# Patient Record
Sex: Female | Born: 1960 | Race: Black or African American | Hispanic: No | Smoking: Former smoker
Health system: Southern US, Community
[De-identification: ages and names within clinical notes are randomized; demographics above are authoritative.]

## PROBLEM LIST (undated history)

## (undated) DIAGNOSIS — M359 Systemic involvement of connective tissue, unspecified: Secondary | ICD-10-CM

## (undated) DIAGNOSIS — C801 Malignant (primary) neoplasm, unspecified: Secondary | ICD-10-CM

## (undated) DIAGNOSIS — M797 Fibromyalgia: Secondary | ICD-10-CM

## (undated) DIAGNOSIS — M329 Systemic lupus erythematosus, unspecified: Secondary | ICD-10-CM

## (undated) DIAGNOSIS — M069 Rheumatoid arthritis, unspecified: Secondary | ICD-10-CM

## (undated) DIAGNOSIS — IMO0002 Reserved for concepts with insufficient information to code with codable children: Secondary | ICD-10-CM

## (undated) DIAGNOSIS — Z8505 Personal history of malignant neoplasm of liver: Secondary | ICD-10-CM

## (undated) DIAGNOSIS — E119 Type 2 diabetes mellitus without complications: Secondary | ICD-10-CM

## (undated) HISTORY — PX: PANCREATECTOMY: SHX1019

## (undated) HISTORY — PX: LIVER SURGERY: SHX698

## (undated) HISTORY — PX: TOTAL ABDOMINAL HYSTERECTOMY: SHX209

## (undated) HISTORY — PX: PANCREAS SURGERY: SHX731

## (undated) HISTORY — PX: BACK SURGERY: SHX140

## (undated) HISTORY — PX: CHOLECYSTECTOMY: SHX55

## (undated) HISTORY — PX: ROTATOR CUFF REPAIR: SHX139

---

## 2016-08-05 ENCOUNTER — Emergency Department
Admission: EM | Admit: 2016-08-05 | Discharge: 2016-08-05 | Disposition: A | Discharge status: Home / Self Care | Payer: Medicare Other | Attending: Emergency Medicine | Admitting: Emergency Medicine

## 2016-08-05 DIAGNOSIS — Z87891 Personal history of nicotine dependence: Secondary | ICD-10-CM | POA: Diagnosis not present

## 2016-08-05 DIAGNOSIS — H9202 Otalgia, left ear: Secondary | ICD-10-CM | POA: Diagnosis not present

## 2016-08-05 MED ORDER — ACETAMINOPHEN-CODEINE #3 300-30 MG PO TABS: 1.0000 | ORAL_TABLET | Freq: Four times a day (QID) | ORAL | 0 refills | Status: DC | PRN

## 2016-08-05 NOTE — Discharge Instructions (Signed)
Your exam is normal. You appear to be having pain from a small bump in the ear canal. Take the pain medicine as needed. Use a cotton swab dampened with alcohol to dry the pimple. Follow-up with your provider for continued symptoms.

## 2016-08-05 NOTE — ED Triage Notes (Signed)
Pt presents to ED for L ear pain that began 1.5 hours ago. States L sided facial pressure when she bent over earlier.

## 2016-08-06 NOTE — ED Provider Notes (Signed)
[facility] Emergency Department Provider Note ____________________________________________  Time seen: 2040  I have reviewed the triage vital signs and the nursing notes.  HISTORY  Chief Complaint  Otalgia  HPI Betty Ferguson is a 55 y.o. female presents to the ED for evaluation of acute pain to the left ear that began about an hour and half prior to arrival. Patient describes she has pain and pressure at the left-sided face and ear and tenderness when she moves the ear. She denies any spontaneous drainage, or fevers. She also denies any recent injury or illness. She did start her daily allergy medicine today due to some sudden sinus congestion. She is otherwise without vertigo, tinnitus, or hearing loss.  No past medical history on file.  There are no active problems to display for this patient.   Past Surgical History:  Procedure Laterality Date  . BACK SURGERY    . CHOLECYSTECTOMY    . TOTAL ABDOMINAL HYSTERECTOMY      Prior to Admission medications   Medication Sig Start Date End Date Taking? Authorizing Provider  acetaminophen-codeine (TYLENOL #3) 300-30 MG tablet Take 1 tablet by mouth every 6 (six) hours as needed for moderate pain. 08/05/16   Elita Dame V Bacon Shaunn Tackitt, PA-C    Allergies Morphine and related and Toradol [ketorolac tromethamine]  No family history on file.  Social History Social History  Substance Use Topics  . Smoking status: Former Research scientist (life sciences)  . Smokeless tobacco: Not on file  . Alcohol use No    Review of Systems  Constitutional: Negative for fever. Eyes: Negative for visual changes. ENT: Negative for sore throat.Left otalgia as above. Cardiovascular: Negative for chest pain. Respiratory: Negative for shortness of breath. Gastrointestinal: Negative for abdominal pain, vomiting and diarrhea. Skin: Negative for rash. Neurological: Negative for headaches, focal weakness or  numbness. ____________________________________________  PHYSICAL EXAM:  VITAL SIGNS: ED Triage Vitals  Enc Vitals Group     BP 08/05/16 2114 (!) 160/86     Pulse Rate 08/05/16 2114 66     Resp 08/05/16 2114 18     Temp 08/05/16 2114 98.2 F (36.8 C)     Temp Source 08/05/16 2114 Oral     SpO2 08/05/16 2114 100 %     Weight 08/05/16 2114 160 lb (72.6 kg)     Height 08/05/16 2114 5\' 2"  (1.575 m)     Head Circumference --      Peak Flow --      Pain Score 08/05/16 2115 9     Pain Loc --      Pain Edu? --      Excl. in [place]? --    Constitutional: Alert and oriented. Well appearing and in no distress. Head: Normocephalic and atraumatic. Eyes: Conjunctivae are normal. PERRL. Normal extraocular movements Ears: Canals clear. TMs intact bilaterally. Left ear canal with a proximal papule noted to the floor of the canal. Patient is exquisitely tender to palpation over this area with the autoscope tip. No canal edema is appreciated. Nose: No congestion/rhinorrhea/epistaxis. Mouth/Throat: Mucous membranes are moist. Hematological/Lymphatic/Immunological: No preauricular lymphadenopathy. Cardiovascular: Normal rate, regular rhythm. Normal distal pulses. Respiratory: Normal respiratory effort. No wheezes/rales/rhonchi. Neurologic:  Normal gait without ataxia. Normal speech and language. No gross focal neurologic deficits are appreciated. Skin:  Skin is warm, dry and intact. No rash noted. ____________________________________________  INITIAL IMPRESSION / ASSESSMENT AND PLAN / ED COURSE  Patient with acute left ear pain which appears to be due to a small inclusion cyst in  the canal. No sign of any acute otitis media or otitis externa. She is discharged with a prescription for #10 Tylenol with codeine to dosed as needed for pain. She will follow with primary care provider for ongoing symptom management.  Clinical Course   ____________________________________________  FINAL CLINICAL  IMPRESSION(S) / ED DIAGNOSES  Final diagnoses:  Otalgia of left ear  Left ear pain      Melvenia Needles, PA-C 08/06/16 0042    Nance Pear, MD 08/07/16 1339

## 2016-09-07 ENCOUNTER — Ambulatory Visit (HOSPITAL_COMMUNITY)
Admission: AD | Admit: 2016-09-07 | Discharge: 2016-09-07 | Disposition: A | Discharge status: Home / Self Care | Payer: Medicare Other | Source: Other Acute Inpatient Hospital | Attending: Emergency Medicine | Admitting: Emergency Medicine

## 2016-09-07 ENCOUNTER — Emergency Department
Admission: EM | Admit: 2016-09-07 | Discharge: 2016-09-07 | Payer: Medicare Other | Attending: Emergency Medicine | Admitting: Emergency Medicine

## 2016-09-07 ENCOUNTER — Emergency Department: Payer: Medicare Other

## 2016-09-07 DIAGNOSIS — R7989 Other specified abnormal findings of blood chemistry: Secondary | ICD-10-CM

## 2016-09-07 DIAGNOSIS — Z79899 Other long term (current) drug therapy: Secondary | ICD-10-CM | POA: Insufficient documentation

## 2016-09-07 DIAGNOSIS — N3000 Acute cystitis without hematuria: Secondary | ICD-10-CM | POA: Diagnosis not present

## 2016-09-07 DIAGNOSIS — A419 Sepsis, unspecified organism: Secondary | ICD-10-CM | POA: Diagnosis not present

## 2016-09-07 DIAGNOSIS — R509 Fever, unspecified: Secondary | ICD-10-CM | POA: Diagnosis present

## 2016-09-07 DIAGNOSIS — R945 Abnormal results of liver function studies: Secondary | ICD-10-CM | POA: Insufficient documentation

## 2016-09-07 DIAGNOSIS — E871 Hypo-osmolality and hyponatremia: Secondary | ICD-10-CM | POA: Diagnosis not present

## 2016-09-07 DIAGNOSIS — Z87891 Personal history of nicotine dependence: Secondary | ICD-10-CM | POA: Diagnosis not present

## 2016-09-07 LAB — TYPE AND SCREEN
ABO/RH(D): O NEG
ANTIBODY SCREEN: NEGATIVE

## 2016-09-07 LAB — CBC WITH DIFFERENTIAL/PLATELET
BASOS ABS: 0 10*3/uL (ref 0–0.1)
Basophils Relative: 0 %
Eosinophils Absolute: 0 10*3/uL (ref 0–0.7)
Eosinophils Relative: 0 %
HEMATOCRIT: 40.9 % (ref 35.0–47.0)
Hemoglobin: 13.7 g/dL (ref 12.0–16.0)
LYMPHS PCT: 5 %
Lymphs Abs: 1.1 10*3/uL (ref 1.0–3.6)
MCH: 28.8 pg (ref 26.0–34.0)
MCHC: 33.5 g/dL (ref 32.0–36.0)
MCV: 86 fL (ref 80.0–100.0)
MONO ABS: 1.4 10*3/uL — AB (ref 0.2–0.9)
MONOS PCT: 6 %
NEUTROS ABS: 21.6 10*3/uL — AB (ref 1.4–6.5)
Neutrophils Relative %: 89 %
Platelets: 397 10*3/uL (ref 150–440)
RBC: 4.75 MIL/uL (ref 3.80–5.20)
RDW: 14.4 % (ref 11.5–14.5)
WBC: 24.2 10*3/uL — ABNORMAL HIGH (ref 3.6–11.0)

## 2016-09-07 LAB — COMPREHENSIVE METABOLIC PANEL
ALBUMIN: 3.2 g/dL — AB (ref 3.5–5.0)
ALT: 96 U/L — ABNORMAL HIGH (ref 14–54)
ANION GAP: 11 (ref 5–15)
AST: 157 U/L — ABNORMAL HIGH (ref 15–41)
Alkaline Phosphatase: 159 U/L — ABNORMAL HIGH (ref 38–126)
BUN: 15 mg/dL (ref 6–20)
CHLORIDE: 97 mmol/L — AB (ref 101–111)
CO2: 23 mmol/L (ref 22–32)
Calcium: 9.4 mg/dL (ref 8.9–10.3)
Creatinine, Ser: 0.89 mg/dL (ref 0.44–1.00)
GFR calc Af Amer: >60 mL/min (ref >60)
GFR calc non Af Amer: >60 mL/min (ref >60)
GLUCOSE: 152 mg/dL — AB (ref 65–99)
POTASSIUM: 4 mmol/L (ref 3.5–5.1)
SODIUM: 131 mmol/L — AB (ref 135–145)
TOTAL PROTEIN: 7.3 g/dL (ref 6.5–8.1)
Total Bilirubin: 0.4 mg/dL (ref 0.3–1.2)

## 2016-09-07 LAB — LACTIC ACID, PLASMA: LACTIC ACID, VENOUS: 1.4 mmol/L (ref 0.5–1.9)

## 2016-09-07 LAB — PROTIME-INR
INR: 1.04
Prothrombin Time: 13.6 seconds (ref 11.4–15.2)

## 2016-09-07 LAB — URINALYSIS, ROUTINE W REFLEX MICROSCOPIC
BILIRUBIN URINE: NEGATIVE
Glucose, UA: NEGATIVE mg/dL
KETONES UR: NEGATIVE mg/dL
Nitrite: POSITIVE — AB
PROTEIN: NEGATIVE mg/dL
SPECIFIC GRAVITY, URINE: 1.014 (ref 1.005–1.030)
SQUAMOUS EPITHELIAL / LPF: NONE SEEN
pH: 6 (ref 5.0–8.0)

## 2016-09-07 LAB — APTT: aPTT: 36 seconds (ref 24–36)

## 2016-09-07 LAB — TROPONIN I: Troponin I: <0.03 ng/mL (ref <0.03)

## 2016-09-07 LAB — LIPASE, BLOOD: LIPASE: 26 U/L (ref 11–51)

## 2016-09-07 MED ORDER — SODIUM CHLORIDE 0.9 % IV BOLUS (SEPSIS)
1000.0000 mL | Freq: Once | INTRAVENOUS | Status: AC
  Administered 2016-09-07: 1000 mL via INTRAVENOUS

## 2016-09-07 MED ORDER — IOPAMIDOL (ISOVUE-300) INJECTION 61%
100.0000 mL | Freq: Once | INTRAVENOUS | Status: AC | PRN
  Administered 2016-09-07: 100 mL via INTRAVENOUS

## 2016-09-07 MED ORDER — ACETAMINOPHEN 650 MG RE SUPP
650.0000 mg | Freq: Once | RECTAL | Status: AC
  Administered 2016-09-07: 650 mg via RECTAL
  Filled 2016-09-07: qty 1

## 2016-09-07 MED ORDER — FENTANYL CITRATE (PF) 100 MCG/2ML IJ SOLN
50.0000 ug | Freq: Once | INTRAMUSCULAR | Status: AC
  Administered 2016-09-07: 50 ug via INTRAVENOUS
  Filled 2016-09-07: qty 2

## 2016-09-07 MED ORDER — PIPERACILLIN-TAZOBACTAM 3.375 G IVPB 30 MIN
3.3750 g | Freq: Once | INTRAVENOUS | Status: AC
  Administered 2016-09-07: 3.375 g via INTRAVENOUS
  Filled 2016-09-07: qty 50

## 2016-09-07 MED ORDER — SODIUM CHLORIDE 0.9 % IV BOLUS (SEPSIS): 250.0000 mL | Freq: Once | INTRAVENOUS | Status: DC

## 2016-09-07 MED ORDER — SODIUM CHLORIDE 0.9 % IV SOLN
1500.0000 mg | Freq: Once | INTRAVENOUS | Status: DC
  Filled 2016-09-07: qty 1500

## 2016-09-07 MED ORDER — VANCOMYCIN HCL 10 G IV SOLR: 1500.0000 mg | Freq: Once | INTRAVENOUS | Status: DC

## 2016-09-07 NOTE — ED Triage Notes (Signed)
Pt developed fever today, highest at home 103.5. Pt has taken no meds for fever. Pt recent post op, pancreatic surgery on 11/20 at [facility]. Pt has JP drain in abdomen. Pt is reported to have altered mental associated w/ fever.

## 2016-09-07 NOTE — Progress Notes (Signed)
Pharmacy Antibiotic Note  Betty Ferguson is a 55 y.o. female admitted on 09/07/2016 with sepsis.  Pharmacy has been consulted for Zosyn dosing.  Plan: Zosyn 3.375 gm IV Q8H EI  Height: 5\' 2"  (157.5 cm) Weight: 160 lb (72.6 kg) IBW/kg (Calculated) : 50.1  Temp (24hrs), Avg:103.2 F (39.6 C), Min:103.2 F (39.6 C), Max:103.2 F (39.6 C)   Recent Labs Lab 09/07/16 2158  WBC 24.2*  CREATININE 0.89    Estimated Creatinine Clearance: 66.6 mL/min (by C-G formula based on SCr of 0.89 mg/dL).    Allergies  Allergen Reactions  . Morphine And Related   . Toradol [Ketorolac Tromethamine]     Thank you for allowing pharmacy to be a part of this patient's care.  Laural Benes, Pharm.D., BCPS Clinical Pharmacist 09/07/2016 10:56 PM

## 2016-09-07 NOTE — ED Notes (Signed)
[facility] at bedside for transport. 

## 2016-09-07 NOTE — ED Provider Notes (Signed)
[facility] Emergency Department Provider Note  ____________________________________________  Time seen: Approximately 9:54 PM  I have reviewed the triage vital signs and the nursing notes.   HISTORY  Chief Complaint Fever and Post-op Problem    HPI Betty Ferguson is a 55 y.o. 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 The patient was discharged from the hospital several days ago, and over the past few days has started having fever and disorientation, not knowing where she is, or the year or the date. This is not her normal mental status. The patient is accompanied by her husband, who gives most of the present illness history, and denies any nausea or vomiting., diarrhea, urinary symptoms. The patient has a JP drain in the left upper quadrant.   History reviewed. No pertinent past medical history.  There are no active problems to display for this patient.   Past Surgical History:  Procedure Laterality Date  . BACK SURGERY    . CHOLECYSTECTOMY    . TOTAL ABDOMINAL HYSTERECTOMY      Current Outpatient Rx  . Order #: [id number] Class: Print    Allergies Morphine and related and Toradol [ketorolac tromethamine]  No family history on file.  Social History Social History  Substance Use Topics  . Smoking status: Former Research scientist (life sciences)  . Smokeless tobacco: Not on file  . Alcohol use No    Review of Systems Constitutional: Positive fever to 103. Positive altered mental status. Positive generalized malaise Eyes: No visual changes. ENT: No congestion or rhinorrhea. Cardiovascular: Denies chest pain. Denies palpitations. Respiratory: Denies shortness of breath.  No cough. Gastrointestinal: Positive abdominal pain.  No nausea, no vomiting.  No diarrhea.  No constipation. Genitourinary: Negative for dysuria. Musculoskeletal: Negative for back pain. Skin: Negative  for rash. Neurological: Negative for headaches. No focal numbness, tingling or weakness.   10-point ROS otherwise negative.  ____________________________________________   PHYSICAL EXAM:  VITAL SIGNS: ED Triage Vitals  Enc Vitals Group     BP 09/07/16 2116 102/66     Pulse Rate 09/07/16 2116 (!) 125     Resp 09/07/16 2116 (!) 22     Temp 09/07/16 2116 (!) 103.2 F (39.6 C)     Temp Source 09/07/16 2116 Oral     SpO2 09/07/16 2116 96 %     Weight 09/07/16 2117 160 lb (72.6 kg)     Height 09/07/16 2117 5\' 2"  (1.575 m)     Head Circumference --      Peak Flow --      Pain Score 09/07/16 2124 8     Pain Loc --      Pain Edu? --      Excl. in [place]? --     Constitutional: Patient is alert but disoriented. She is able to answer some questions and follow commands. GCS 15. Eyes: Conjunctivae are normal.  EOMI. No scleral icterus. No eye discharge. Head: Atraumatic. Nose: No congestion/rhinnorhea. Mouth/Throat: Mucous membranes are dry.  Neck: No stridor.  Supple.  No JVD. No meningismus. Cardiovascular: Fast rate, regular rhythm. No murmurs, rubs or gallops.  Respiratory: Tachypnea with mild accessory muscle use. No retractions. Good air exchange. No accessory muscle use or retractions. Lungs CTAB.  No wheezes, rales or ronchi. Gastrointestinal: Soft and mildly distended. Multiple laparoscopic incisions that are clean, dry, scabbed and without any purulent discharge or surrounding erythema. Patient has a JP drain in the left upper quadrant  that has some purulent discharge was serous fluid. She has tenderness to palpation diffusely in the left upper quadrant. Musculoskeletal: No LE edema. Neurologic:  Alert to person, has to be prompted twice for the year, and does not know the month.  Speech is clear.  Face and smile are symmetric.  EOMI.  Moves all extremities well. Skin:  Skin is warm, dry and intact. No rash noted. Psychiatric: Mood and affect are normal. Speech and behavior are  normal.  Normal judgement.  ____________________________________________   LABS (all labs ordered are listed, but only abnormal results are displayed)  Labs Reviewed  COMPREHENSIVE METABOLIC PANEL - Abnormal; Notable for the following:       Result Value   Sodium 131 (*)    Chloride 97 (*)    Glucose, Bld 152 (*)    Albumin 3.2 (*)    AST 157 (*)    ALT 96 (*)    Alkaline Phosphatase 159 (*)    All other components within normal limits  CBC WITH DIFFERENTIAL/PLATELET - Abnormal; Notable for the following:    WBC 24.2 (*)    Neutro Abs 21.6 (*)    Monocytes Absolute 1.4 (*)    All other components within normal limits  URINALYSIS, ROUTINE W REFLEX MICROSCOPIC - Abnormal; Notable for the following:    Color, Urine YELLOW (*)    APPearance HAZY (*)    Hgb urine dipstick SMALL (*)    Nitrite POSITIVE (*)    Leukocytes, UA MODERATE (*)    Bacteria, UA MANY (*)    All other components within normal limits  CULTURE, BLOOD (ROUTINE X 2)  CULTURE, BLOOD (ROUTINE X 2)  URINE CULTURE  RAPID INFLUENZA A&B ANTIGENS ([facility] ONLY)  LACTIC ACID, PLASMA  LIPASE, BLOOD  TROPONIN I  APTT  PROTIME-INR  LACTIC ACID, PLASMA  TYPE AND SCREEN   ____________________________________________  EKG  ED ECG REPORT I, Eula Listen, the attending physician, personally viewed and interpreted this ECG.   Date: 09/07/2016  EKG Time: 2324  Rate: 95  Rhythm: normal sinus rhythm  Axis: normal  Intervals:none  ST&T Change: No STEMI  ____________________________________________  RADIOLOGY  Dg Chest Port 1 View  Result Date: 09/07/2016 CLINICAL DATA:  Fever today.  Recent abdominal surgery. EXAM: PORTABLE CHEST 1 VIEW COMPARISON:  None. FINDINGS: A single AP portable view of the chest demonstrates no focal airspace consolidation or alveolar edema. The lungs are grossly clear. There is no large effusion or pneumothorax. Cardiac and mediastinal contours appear unremarkable. IMPRESSION: No  active disease. Electronically Signed   By: Andreas Newport M.D.   On: 09/07/2016 22:45    ____________________________________________   PROCEDURES  Procedure(s) performed: None  Procedures  Critical Care performed: Yes, see critical care note(s) ____________________________________________   INITIAL IMPRESSION / ASSESSMENT AND PLAN / ED COURSE  Pertinent labs & imaging results that were available during my care of the patient were reviewed by me and considered in my medical decision making (see chart for details).  55 y.o. female status post recent pancreatectomy for neuroendocrine tumor presenting with fever and altered mental status, abdominal pain. Overall, I'm concerned that this patient is septic. I have ordered intravenous fluid for fluid resuscitation and immediate and antibiotics with vancomycin and Zosyn. We will do a full evaluation for infection including chest x-ray, urinalysis, blood cultures, and CT abdomen and evaluating for abscess, perforation, or other surgical complication. I will treat the patient's pain with fentanyl.  I have initiated a call to  the transfer center, the patient has been accepted by her surgeon, Dr. Donnal Moat for immediate transfer.  CRITICAL CARE Performed by: Eula Listen   Total critical care time: 45 minutes  Critical care time was exclusive of separately billable procedures and treating other patients.  Critical care was necessary to treat or prevent imminent or life-threatening deterioration.  Critical care was time spent personally by me on the following activities: development of treatment plan with patient and/or surrogate as well as nursing, discussions with consultants, evaluation of patient's response to treatment, examination of patient, obtaining history from patient or surrogate, ordering and performing treatments and interventions, ordering and review of laboratory studies, ordering and review of radiographic studies,  pulse oximetry and re-evaluation of patient's condition.  ----------------------------------------- 11:26 PM on 09/07/2016 -----------------------------------------  At the time of transfer, I reevaluated the patient. Her heart rate has significantly improved and is below 100. Her blood pressure continues to be maintained. She is feeling better. Her urinalysis is positive for UTI. She has had her CT scan but we don't have the read on it yet. The images will travel with her to [facility]. She is being transferred in improved condition.  ____________________________________________  FINAL CLINICAL IMPRESSION(S) / ED DIAGNOSES  Final diagnoses:  Sepsis, due to unspecified organism [facility])  Acute cystitis without hematuria    Clinical Course       NEW MEDICATIONS STARTED DURING THIS VISIT:  New Prescriptions   No medications on file      Eula Listen, MD 09/07/16 2326

## 2016-09-10 LAB — URINE CULTURE

## 2016-09-11 NOTE — Progress Notes (Signed)
ED Antimicrobial Stewardship Positive Culture Follow Up   Danesa Nasir is an 55 y.o. female who presented to [facility] on 09/07/2016 with a chief complaint of No chief complaint on file.   Recent Results (from the past 720 hour(s))  Blood Culture (routine x 2)     Status: None (Preliminary result)   Collection Time: 09/07/16  9:58 PM  Result Value Ref Range Status   Specimen Description BLOOD  L HAND  Final   Special Requests   Final    BOTTLES DRAWN AEROBIC AND ANAEROBIC  AER 9 ML ANA 8 ML   Culture NO GROWTH 4 DAYS  Final   Report Status PENDING  Incomplete  Blood Culture (routine x 2)     Status: None (Preliminary result)   Collection Time: 09/07/16  9:58 PM  Result Value Ref Range Status   Specimen Description BLOOD  L AC  Final   Special Requests   Final    BOTTLES DRAWN AEROBIC AND ANAEROBIC  AER 15 ML ANA 12 ML    Culture NO GROWTH 4 DAYS  Final   Report Status PENDING  Incomplete  Urine culture     Status: Abnormal   Collection Time: 09/07/16 10:55 PM  Result Value Ref Range Status   Specimen Description URINE, RANDOM  Final   Special Requests NONE  Final   Culture >=100,000 COLONIES/mL ESCHERICHIA COLI (A)  Final   Report Status 09/10/2016 FINAL  Final   Organism ID, Bacteria ESCHERICHIA COLI (A)  Final      Susceptibility   Escherichia coli - MIC*    AMPICILLIN >=32 RESISTANT Resistant     CEFAZOLIN <=4 SENSITIVE Sensitive     CEFTRIAXONE <=1 SENSITIVE Sensitive     CIPROFLOXACIN <=0.25 SENSITIVE Sensitive     GENTAMICIN <=1 SENSITIVE Sensitive     IMIPENEM <=0.25 SENSITIVE Sensitive     NITROFURANTOIN <=16 SENSITIVE Sensitive     TRIMETH/SULFA >=320 RESISTANT Resistant     AMPICILLIN/SULBACTAM 16 INTERMEDIATE Intermediate     PIP/TAZO <=4 SENSITIVE Sensitive     Extended ESBL NEGATIVE Sensitive     * >=100,000 COLONIES/mL ESCHERICHIA COLI    Patient was transferred from [facility] ED to [facility] on 12/5 with sepsis. Urine culture resulted today  growing E. Coli. [facility] but they informed me that patient had been discharged this afternoon.  Discussed case with Dr. Burlene Arnt in [facility] ED. Patient was discharged on Augmentin for a UTI from [place]. Urine culture growing E.coli with intermediate suseptibilities to ampicillin/sulbactam. Per MD, will call patient and give new prescription for cephalexin 500 mg PO BID x 7 days.  Called patient at 63 - family reports she is currently on the way home from [place]. Left phone number asking her to return phone call to [place].  New antibiotic prescription: cephalexin 500 mg PO BID x 7 days   ED Provider: Dr. Karsten Ro, PharmD Clinical Pharmacist 09/11/2016, 4:27 PM

## 2016-09-11 NOTE — ED Provider Notes (Signed)
-----------------------------------------   4:16 PM on 09/11/2016 -----------------------------------------  Patient culture resulted with a intermediate sensitivity to Unasyn, she was sent home from [facility], according to records, on Augmentin. This is possibly not the optimal course for her. We will change to Keflex. We'll advise the patient return if she feels worse.   Schuyler Amor, MD 09/11/16 [phone]

## 2016-09-12 LAB — CULTURE, BLOOD (ROUTINE X 2)
Culture: NO GROWTH
Culture: NO GROWTH

## 2016-09-12 NOTE — Progress Notes (Signed)
Pharmacist spoke with patient regarding ED cultures on 12/9 (see note). Prescription called in this morning to [facility] in [place] [phone]).   Rx: cephalexin 500 mg PO BID x 7 days MD: Tilman Neat, PharmD, BCPS 09/12/16 8:08 AM

## 2016-10-19 ENCOUNTER — Encounter: Payer: Self-pay | Admitting: Emergency Medicine

## 2016-10-19 ENCOUNTER — Emergency Department: Payer: Medicare Other

## 2016-10-19 ENCOUNTER — Emergency Department
Admission: EM | Admit: 2016-10-19 | Discharge: 2016-10-19 | Disposition: A | Discharge status: Home / Self Care | Payer: Medicare Other | Attending: Emergency Medicine | Admitting: Emergency Medicine

## 2016-10-19 DIAGNOSIS — R109 Unspecified abdominal pain: Secondary | ICD-10-CM | POA: Diagnosis present

## 2016-10-19 DIAGNOSIS — Z87891 Personal history of nicotine dependence: Secondary | ICD-10-CM | POA: Insufficient documentation

## 2016-10-19 DIAGNOSIS — Z79899 Other long term (current) drug therapy: Secondary | ICD-10-CM | POA: Diagnosis not present

## 2016-10-19 DIAGNOSIS — K5900 Constipation, unspecified: Secondary | ICD-10-CM | POA: Diagnosis not present

## 2016-10-19 HISTORY — DX: Reserved for concepts with insufficient information to code with codable children: IMO0002

## 2016-10-19 HISTORY — DX: Systemic lupus erythematosus, unspecified: M32.9

## 2016-10-19 HISTORY — DX: Fibromyalgia: M79.7

## 2016-10-19 LAB — COMPREHENSIVE METABOLIC PANEL
ALBUMIN: 3.6 g/dL (ref 3.5–5.0)
ALK PHOS: 98 U/L (ref 38–126)
ALT: 9 U/L — ABNORMAL LOW (ref 14–54)
ANION GAP: 7 (ref 5–15)
AST: 17 U/L (ref 15–41)
BILIRUBIN TOTAL: 0.3 mg/dL (ref 0.3–1.2)
BUN: 14 mg/dL (ref 6–20)
CO2: 29 mmol/L (ref 22–32)
Calcium: 9.6 mg/dL (ref 8.9–10.3)
Chloride: 101 mmol/L (ref 101–111)
Creatinine, Ser: 0.95 mg/dL (ref 0.44–1.00)
GLUCOSE: 104 mg/dL — AB (ref 65–99)
POTASSIUM: 3.9 mmol/L (ref 3.5–5.1)
Sodium: 137 mmol/L (ref 135–145)
TOTAL PROTEIN: 7.4 g/dL (ref 6.5–8.1)

## 2016-10-19 LAB — CBC
HCT: 44.1 % (ref 35.0–47.0)
HEMOGLOBIN: 14.3 g/dL (ref 12.0–16.0)
MCH: 28 pg (ref 26.0–34.0)
MCHC: 32.4 g/dL (ref 32.0–36.0)
MCV: 86.7 fL (ref 80.0–100.0)
Platelets: 234 10*3/uL (ref 150–440)
RBC: 5.09 MIL/uL (ref 3.80–5.20)
RDW: 14.9 % — ABNORMAL HIGH (ref 11.5–14.5)
WBC: 6 10*3/uL (ref 3.6–11.0)

## 2016-10-19 LAB — URINALYSIS, COMPLETE (UACMP) WITH MICROSCOPIC
BILIRUBIN URINE: NEGATIVE
Glucose, UA: NEGATIVE mg/dL
HGB URINE DIPSTICK: NEGATIVE
KETONES UR: NEGATIVE mg/dL
LEUKOCYTES UA: NEGATIVE
NITRITE: NEGATIVE
Protein, ur: NEGATIVE mg/dL
SPECIFIC GRAVITY, URINE: 1.009 (ref 1.005–1.030)
pH: 6 (ref 5.0–8.0)

## 2016-10-19 LAB — LIPASE, BLOOD: Lipase: 37 U/L (ref 11–51)

## 2016-10-19 MED ORDER — IOPAMIDOL (ISOVUE-300) INJECTION 61%
100.0000 mL | Freq: Once | INTRAVENOUS | Status: AC | PRN
  Administered 2016-10-19: 100 mL via INTRAVENOUS

## 2016-10-19 MED ORDER — IOPAMIDOL (ISOVUE-300) INJECTION 61%
15.0000 mL | INTRAVENOUS | Status: AC
  Administered 2016-10-19 (×2): 15 mL via ORAL

## 2016-10-19 MED ORDER — HYDROMORPHONE HCL 1 MG/ML IJ SOLN
1.0000 mg | Freq: Once | INTRAMUSCULAR | Status: AC
  Administered 2016-10-19: 1 mg via INTRAVENOUS
  Filled 2016-10-19: qty 1

## 2016-10-19 MED ORDER — ONDANSETRON HCL 4 MG/2ML IJ SOLN
4.0000 mg | Freq: Once | INTRAMUSCULAR | Status: AC
  Administered 2016-10-19: 4 mg via INTRAVENOUS
  Filled 2016-10-19: qty 2

## 2016-10-19 MED ORDER — SODIUM CHLORIDE 0.9 % IV BOLUS (SEPSIS)
1000.0000 mL | Freq: Once | INTRAVENOUS | Status: AC
  Administered 2016-10-19: 1000 mL via INTRAVENOUS

## 2016-10-19 NOTE — Discharge Instructions (Signed)
As we discussed please increase her MiraLAX to 3 times daily and use a fleets enema for symptom relief. Return to the emergency department for any worsening abdominal pain, fever, or any other symptom personally concerning to yourself. Please follow-up with your surgeon as soon as possible, call tomorrow to inform them of today's ER visit.

## 2016-10-19 NOTE — ED Notes (Addendum)
MD at the bedside to discuss CT results and plan of care.

## 2016-10-19 NOTE — ED Triage Notes (Signed)
Pt to ED c/o LUQ pain for "a while" worse since Sunday and SOB as well.  States had pancreas removed November 20th at [facility].  Pt states nausea, no v/d.

## 2016-10-19 NOTE — ED Notes (Signed)
Pt resting on stretcher in hallway. Had been complaining of returning/worsening pain. MD aware and order received for pain medication. IV fluids infusing without difficulty. Awaiting CT results. Provided for comfort and safety and will continue to assess.

## 2016-10-19 NOTE — ED Provider Notes (Signed)
[facility] Emergency Department Provider Note  Time seen: 4:52 PM  I have reviewed the triage vital signs and the nursing notes.   HISTORY  Chief Complaint Abdominal Pain and Post-op Problem    HPI Betty Ferguson is a 56 y.o. female  with a history of fibromyalgia, lupus, pancreatectomy in November 2017, surgical drain removed last week who presents to the emergency department for increased left-sided abdominal pain. Patient states she has somewhat chronic abdominal pain for which she is prescribed pain medication. She states over the past 3 days significant worsening of the pain describes as severe currently. Has been taking her home Pain medication without relief so she came to the emergency department. Patient states she spoke to her nurse at [facility] recommended she come to the local ER for evaluation. Patient denies fever. States some nausea but denies vomiting. Describes the pain as dull aching constant pain  Past Medical History:  Diagnosis Date  . Fibromyalgia   . Lupus     There are no active problems to display for this patient.   Past Surgical History:  Procedure Laterality Date  . BACK SURGERY    . CHOLECYSTECTOMY    . PANCREATECTOMY    . ROTATOR CUFF REPAIR Right   . TOTAL ABDOMINAL HYSTERECTOMY      Prior to Admission medications   Medication Sig Start Date End Date Taking? Authorizing Provider  acetaminophen-codeine (TYLENOL #3) 300-30 MG tablet Take 1 tablet by mouth every 6 (six) hours as needed for moderate pain. 08/05/16   Jenise V Bacon Menshew, PA-C    Allergies  Allergen Reactions  . Morphine And Related   . Toradol [Ketorolac Tromethamine]     History reviewed. No pertinent family history.  Social History Social History  Substance Use Topics  . Smoking status: Former Research scientist (life sciences)  . Smokeless tobacco: Never Used  . Alcohol use No    Review of Systems Constitutional: Negative for fever. Cardiovascular: Negative for chest  pain. Respiratory: Negative for shortness of breath. Gastrointestinal:  left-sided abdominal pain. Negative for vomiting. Positive for nausea. Genitourinary: Negative for dysuria. Neurological: Negative for headache 10-point ROS otherwise negative.  ____________________________________________   PHYSICAL EXAM:  VITAL SIGNS: ED Triage Vitals  Enc Vitals Group     BP 10/19/16 1304 130/79     Pulse Rate 10/19/16 1304 84     Resp 10/19/16 1304 20     Temp 10/19/16 1304 98.4 F (36.9 C)     Temp Source 10/19/16 1304 Oral     SpO2 10/19/16 1304 100 %     Weight 10/19/16 1303 165 lb (74.8 kg)     Height 10/19/16 1303 5\' 2"  (1.575 m)     Head Circumference --      Peak Flow --      Pain Score 10/19/16 1311 10     Pain Loc --      Pain Edu? --      Excl. in [place]? --    Constitutional: Alert and oriented. Well appearing and in no distress. Eyes: Normal exam ENT   Head: Normocephalic and atraumatic.   Mouth/Throat: Mucous membranes are moist. Cardiovascular: Normal rate, regular rhythm. No murmurs Respiratory: Normal respiratory effort without tachypnea nor retractions. Breath sounds are clear Gastrointestinal:  Soft,  Moderate left abdominal tenderness palpation. No rebound or guarding. No distention. Musculoskeletal: Nontender with normal range of motion in all extremities.  Neurologic:  Normal speech and language. No gross focal neurologic deficits  Skin:  Skin  is warm, dry and intact.  Psychiatric: Mood and affect are normal.  ____________________________________________    EKG  EKG reviewed and interpreted by myself shows normal sinus rhythm at 72 bpm. Narrow QRS, normal axis, normal intervals, no ST changes.  ____________________________________________    RADIOLOGY  CT shows no acute findings besides moderate retained stool.   ____________________________________________   INITIAL IMPRESSION / ASSESSMENT AND PLAN / ED COURSE  Pertinent labs & imaging  results that were available during my care of the patient were reviewed by me and considered in my medical decision making (see chart for details).  Overall well-appearing patient in no distress. Patient states significant left-sided abdominal pain Patient's labs are within normal limits, white blood cell count is normal. Urinalysis is negative. Lipase is normal. LFTs are normal.Given the pplaint of increased abdominal pain one week after a surgical drain was removed we'll proceed with a CT of the abdomen/pelvis to further evaluate and rule out intra-abdominal pathology. Patient is agreeable to this plan. We'll treat the patient's pain and nausea while awaiting CT results.  CT shows a moderate amount of retained stool otherwise negative. Patient states she was having loose stools or she had stopped taking her prescribed senna and MiraLAX. I discussed with the patient the need to restart his medications as well as using a fleets enema. Patient is agreeable. I discussed return precautions for any increased pain or fever. Otherwise the patient will call her surgeon tomorrow.  ____________________________________________   FINAL CLINICAL IMPRESSION(S) / ED DIAGNOSES  Left-sided abdominal pain constipation   Harvest Dark, MD 10/19/16 2018

## 2016-10-19 NOTE — ED Triage Notes (Signed)
Pt reports hx of pancreas removal in November, reports pain in abdomen and some shortness of breath today. Layla Barter MD and was told to come here for evaluation.

## 2017-06-01 ENCOUNTER — Emergency Department: Payer: Medicare Other

## 2017-06-01 ENCOUNTER — Emergency Department
Admission: EM | Admit: 2017-06-01 | Discharge: 2017-06-01 | Disposition: A | Discharge status: Home / Self Care | Payer: Medicare Other | Attending: Emergency Medicine | Admitting: Emergency Medicine

## 2017-06-01 ENCOUNTER — Encounter: Payer: Self-pay | Admitting: *Deleted

## 2017-06-01 DIAGNOSIS — Z87891 Personal history of nicotine dependence: Secondary | ICD-10-CM | POA: Diagnosis not present

## 2017-06-01 DIAGNOSIS — M79604 Pain in right leg: Secondary | ICD-10-CM | POA: Diagnosis not present

## 2017-06-01 DIAGNOSIS — M797 Fibromyalgia: Secondary | ICD-10-CM | POA: Diagnosis not present

## 2017-06-01 DIAGNOSIS — M25561 Pain in right knee: Secondary | ICD-10-CM

## 2017-06-01 MED ORDER — DICLOFENAC SODIUM 75 MG PO TBEC: 75.0000 mg | DELAYED_RELEASE_TABLET | Freq: Two times a day (BID) | ORAL | 0 refills | Status: DC

## 2017-06-01 MED ORDER — METOCLOPRAMIDE HCL 10 MG PO TABS
10.0000 mg | ORAL_TABLET | Freq: Once | ORAL | Status: AC
  Administered 2017-06-01: 10 mg via ORAL
  Filled 2017-06-01: qty 1

## 2017-06-01 MED ORDER — ORPHENADRINE CITRATE ER 100 MG PO TB12: 100.0000 mg | ORAL_TABLET | Freq: Two times a day (BID) | ORAL | 0 refills | Status: DC | PRN

## 2017-06-01 MED ORDER — OXYCODONE-ACETAMINOPHEN 5-325 MG PO TABS
1.0000 | ORAL_TABLET | Freq: Once | ORAL | Status: AC
  Administered 2017-06-01: 1 via ORAL
  Filled 2017-06-01: qty 1

## 2017-06-01 MED ORDER — ORPHENADRINE CITRATE 30 MG/ML IJ SOLN
60.0000 mg | INTRAMUSCULAR | Status: AC
  Administered 2017-06-01: 60 mg via INTRAMUSCULAR
  Filled 2017-06-01: qty 2

## 2017-06-01 NOTE — ED Triage Notes (Signed)
Pt to triage via wheelchair.  Pt has right knee pain.  No known injury.  No swelling noted.  States painful to ambulate.  Pt alert.

## 2017-06-01 NOTE — Discharge Instructions (Signed)
Your exam, x-ray, and ultrasound study are negative for any fracture of the knee or a blood clot of the right leg. Your symptoms may represent sciatic nerve irritation from the back. Take the prescription meds  along with your home meds, as directed. Follow-up with your provider for continued symptoms. Return to the ED as needed.

## 2017-06-01 NOTE — ED Provider Notes (Signed)
[facility] Emergency Department Provider Note ____________________________________________  Time seen: 1948  I have reviewed the triage vital signs and the nursing notes.  HISTORY  Chief Complaint  Knee Pain  HPI Betty Ferguson is a 56 y.o. female presents to the ED for evaluation of right knee pain, the patient deniesany acute injury, accident, or trauma. She was apparently seen at a local urgent care prior to arrival. She was referred here for further evaluation of a potential DVT. She notes pain from the posterior thigh to the anterior & medial knee, popliteal space, and calf on interview. She notes onset of pain at about 2 pm while at home. She denies any fall, trip, or trauma. She reports pain with ambulation, but denies any catching, clicking, locking, or give-way. She has a history of chronic LBP s/p surgery, but denies RLE radicular or sciatic symptoms, past or present. She doses oxycodone, oxycontin, gabapentin, and cyclobenzaprine for pain relief. She is without bladder or bowel incontinence or distal paresthesias.   Past Medical History:  Diagnosis Date  . Fibromyalgia   . Lupus     There are no active problems to display for this patient.   Past Surgical History:  Procedure Laterality Date  . BACK SURGERY    . CHOLECYSTECTOMY    . PANCREATECTOMY    . ROTATOR CUFF REPAIR Right   . TOTAL ABDOMINAL HYSTERECTOMY      Prior to Admission medications   Medication Sig Start Date End Date Taking? Authorizing Provider  acetaminophen-codeine (TYLENOL #3) 300-30 MG tablet Take 1 tablet by mouth every 6 (six) hours as needed for moderate pain. 08/05/16   Gesenia Bantz, Dannielle Karvonen, PA-C   Allergies Morphine and related and Toradol [ketorolac tromethamine]  No family history on file.  Social History Social History  Substance Use Topics  . Smoking status: Former Research scientist (life sciences)  . Smokeless tobacco: Never Used  . Alcohol use No    Review of  Systems  Constitutional: Negative for fever. Cardiovascular: Negative for chest pain. Respiratory: Negative for shortness of breath. Musculoskeletal: Negative for back pain. Right leg and knee pain as above.  Skin: Negative for rash. Neurological: Negative for headaches, focal weakness or numbness. ____________________________________________  PHYSICAL EXAM:  VITAL SIGNS: ED Triage Vitals  Enc Vitals Group     BP 06/01/17 1822 135/73     Pulse Rate 06/01/17 1822 79     Resp 06/01/17 1822 18     Temp 06/01/17 1822 98.8 F (37.1 C)     Temp Source 06/01/17 1822 Oral     SpO2 06/01/17 1822 99 %     Weight 06/01/17 1820 185 lb (83.9 kg)     Height 06/01/17 1820 5\' 2"  (1.575 m)     Head Circumference --      Peak Flow --      Pain Score 06/01/17 1819 10     Pain Loc --      Pain Edu? --      Excl. in [place]? --     Constitutional: Alert and oriented. Well appearing and in no distress. Head: Normocephalic and atraumatic. Cardiovascular: Normal distal pulses. Musculoskeletal: Right lower extremity and knee without any obvious deformity, dislocation, effusion, or edema. Patient with normal patella tracking at the right knee. No significant valgus or varus joint stress is appreciative. Negative anterior-posterior drawer sign. Nontender with normal range of motion in all extremities.  Neurologic:  Normal gross sensation. Normal speech and language. No gross focal neurologic deficits are  appreciated. Skin:  Skin is warm, dry and intact. No rash noted. ____________________________________________   RADIOLOGY  Right Knee  IMPRESSION: Minimal degenerative change.  No acute osseous abnormality.  Venous US RLE  IMPRESSION: No evidence of DVT within the right lower extremity.  I, Sundiata Ferrick, Dannielle Karvonen, personally viewed and evaluated these images (plain radiographs) as part of my medical decision making, as well as reviewing the written report by the  radiologist. ____________________________________________  PROCEDURES  Norflex 60 mg IM Reglan 10 mg PO Oxycodone 5-325 mg PO ____________________________________________  INITIAL IMPRESSION / ASSESSMENT AND PLAN / ED COURSE  Patient with ED evaluation of sudden right lower extremity pain about the posterior leg and knee. She is evaluated here for acute knee pain and possible DVT. Her x-ray and the ultrasound are both negative and reassuring at this time. Her symptoms may represent acute on chronic radicular symptoms. Patient will be discharged to follow-up with ortho or her primary care provider for ongoing symptom management. She will continue no cervical medications and a prescription for Voltaren and orphenadrine are provided for acute anti-inflammatory & muscle spasm pain relief, respectively. ____________________________________________  FINAL CLINICAL IMPRESSION(S) / ED DIAGNOSES  Final diagnoses:  Right leg pain  Acute pain of right knee      Carmie End, Dannielle Karvonen, PA-C 06/01/17 2132    Darel Hong, MD 06/01/17 2137

## 2018-02-10 ENCOUNTER — Encounter: Payer: Self-pay | Admitting: Emergency Medicine

## 2018-02-10 DIAGNOSIS — Z79899 Other long term (current) drug therapy: Secondary | ICD-10-CM | POA: Insufficient documentation

## 2018-02-10 DIAGNOSIS — M25552 Pain in left hip: Secondary | ICD-10-CM | POA: Insufficient documentation

## 2018-02-10 DIAGNOSIS — Z87891 Personal history of nicotine dependence: Secondary | ICD-10-CM | POA: Diagnosis not present

## 2018-02-10 DIAGNOSIS — R51 Headache: Secondary | ICD-10-CM | POA: Diagnosis not present

## 2018-02-10 NOTE — ED Triage Notes (Signed)
Patient states she has a "splitting headache" 9/10 pain with blurry vision.  She states she has a hx of "chronic headaches years ago".  She also is co left hip pain.  She states last night while she was walking she felt a sting in her hip like a bee stung her and has had trouble walking since.

## 2018-02-11 ENCOUNTER — Emergency Department: Payer: Medicare Other

## 2018-02-11 ENCOUNTER — Emergency Department
Admission: EM | Admit: 2018-02-11 | Discharge: 2018-02-11 | Disposition: A | Discharge status: Home / Self Care | Payer: Medicare Other | Attending: Emergency Medicine | Admitting: Emergency Medicine

## 2018-02-11 DIAGNOSIS — R51 Headache: Secondary | ICD-10-CM | POA: Diagnosis not present

## 2018-02-11 DIAGNOSIS — R519 Headache, unspecified: Secondary | ICD-10-CM

## 2018-02-11 MED ORDER — HYDROMORPHONE HCL 1 MG/ML IJ SOLN
1.0000 mg | Freq: Once | INTRAMUSCULAR | Status: AC
  Administered 2018-02-11: 1 mg via INTRAVENOUS
  Filled 2018-02-11: qty 1

## 2018-02-11 MED ORDER — PROCHLORPERAZINE EDISYLATE 10 MG/2ML IJ SOLN
10.0000 mg | Freq: Once | INTRAMUSCULAR | Status: AC
  Administered 2018-02-11: 10 mg via INTRAVENOUS
  Filled 2018-02-11: qty 2

## 2018-02-11 MED ORDER — PROCHLORPERAZINE EDISYLATE 10 MG/2ML IJ SOLN
INTRAMUSCULAR | Status: AC
  Administered 2018-02-11: 10 mg via INTRAVENOUS
  Filled 2018-02-11: qty 2

## 2018-02-11 MED ORDER — HYDROMORPHONE HCL 1 MG/ML IJ SOLN
0.5000 mg | Freq: Once | INTRAMUSCULAR | Status: AC
  Administered 2018-02-11: 0.5 mg via INTRAVENOUS
  Filled 2018-02-11 (×2): qty 1

## 2018-02-11 MED ORDER — HYDROMORPHONE HCL 1 MG/ML IJ SOLN
1.0000 mg | Freq: Once | INTRAMUSCULAR | Status: AC
  Administered 2018-02-11: 1 mg via INTRAVENOUS

## 2018-02-11 MED ORDER — HYDROMORPHONE HCL 1 MG/ML IJ SOLN
INTRAMUSCULAR | Status: AC
  Filled 2018-02-11: qty 1

## 2018-02-11 NOTE — ED Notes (Signed)
PT states having pain in her head and her "rear end". Pt states that she feels like she was stung by something on her left side but she was not stung by anything. Pt alert and oriented x 4. Family at bedside.

## 2018-02-11 NOTE — Discharge Instructions (Signed)
Please use Tylenol for pain if it starts to come back. If it gets severe please return here. Youcan also follow-up with orthopedics for the hip if it gets bad again. Dr. Harlow Mares is on-call for orthopedics.

## 2018-02-11 NOTE — ED Provider Notes (Signed)
[facility] Emergency Department Provider Note   ____________________________________________   First MD Initiated Contact with Patient 02/11/18 [phone]     (approximate)  I have reviewed the triage vital signs and the nursing notes.   HISTORY  Chief Complaint Headache and Hip Pain    HPI Betty Ferguson is a 57 y.o. female Patient reports gradual onset of severe headache radiating from the front of the head down to the back of the neck. She had headaches like this years ago but hasn't had one for some time. She also reports she was walking earlier and felt something like a sting her and her head she been having severe pain there ever since. It starts to push on it or lying on it or move. The pain is really in her left buttocks. She wonders if she could've possibly pulled a muscle. She reports she has had hallucinations when she was on a morphine pump for her hysterectomy. She has a history of rheumatoid arthritis lupus and fibromyalgiaand has also had back surgery and rotator cuff repair   Past Medical History:  Diagnosis Date  . Fibromyalgia   . Lupus ([place])     There are no active problems to display for this patient.   Past Surgical History:  Procedure Laterality Date  . BACK SURGERY    . CHOLECYSTECTOMY    . PANCREATECTOMY    . ROTATOR CUFF REPAIR Right   . TOTAL ABDOMINAL HYSTERECTOMY      Prior to Admission medications   Medication Sig Start Date End Date Taking? Authorizing Provider  acetaminophen-codeine (TYLENOL #3) 300-30 MG tablet Take 1 tablet by mouth every 6 (six) hours as needed for moderate pain. 08/05/16   Menshew, Dannielle Karvonen, PA-C  diclofenac (VOLTAREN) 75 MG EC tablet Take 1 tablet (75 mg total) by mouth 2 (two) times daily. 06/01/17   Menshew, Dannielle Karvonen, PA-C  orphenadrine (NORFLEX) 100 MG tablet Take 1 tablet (100 mg total) by mouth 2 (two) times daily as needed for muscle spasms. 06/01/17   Menshew, Dannielle Karvonen, PA-C     Allergies Toradol [ketorolac tromethamine] and Morphine and related  No family history on file.  Social History Social History   Tobacco Use  . Smoking status: Former Research scientist (life sciences)  . Smokeless tobacco: Never Used  Substance Use Topics  . Alcohol use: No  . Drug use: No    Review of Systems  Constitutional: No fever/chills Eyes: patient says her vision is somewhat blurry ENT: No sore throat. Cardiovascular: Denies chest pain. Respiratory: Denies shortness of breath. Gastrointestinal: No abdominal pain.  No nausea, no vomiting.  No diarrhea.  No constipation. Genitourinary: Negative for dysuria. Musculoskeletal: Negative for back pain. Skin: Negative for rash. Neurological: Negative for focal weakness   ____________________________________________   PHYSICAL EXAM:  VITAL SIGNS: ED Triage Vitals  Enc Vitals Group     BP 02/10/18 2321 135/69     Pulse Rate 02/10/18 2321 77     Resp 02/10/18 2321 16     Temp 02/10/18 2321 97.9 F (36.6 C)     Temp Source 02/10/18 2321 Oral     SpO2 02/10/18 2321 98 %     Weight --      Height --      Head Circumference --      Peak Flow --      Pain Score 02/10/18 2323 9     Pain Loc --      Pain Edu? --  Excl. in [place]? --     Constitutional: Alert and oriented. Well appearing and in no acute distress. Eyes: Conjunctivae are normal. PERRL. EOMI. Head: Atraumatic. Nose: No congestion/rhinnorhea. Mouth/Throat: Mucous membranes are moist.  Oropharynx non-erythematous. Neck: No stridor.  Cardiovascular: Normal rate, regular rhythm. Grossly normal heart sounds.  Good peripheral circulation. Respiratory: Normal respiratory effort.  No retractions. Lungs CTAB. Gastrointestinal: Soft and nontender. No distention. No abdominal bruits. No CVA tenderness. }Musculoskeletal: No lower extremity tenderness nor edema.  No joint effusions.patient does have pain on palpation of the left buttocks. Neurologic:  Normal speech and language. No  gross focal neurologic deficits are appreciated. cranial nerves II through XII are intact, visual fields were not checked in visual acuity was not done motor strength is 5 over 5 throughout sensation is normal straight leg raise appears to be negative. Skin:  Skin is warm, dry and intact. No rash noted. Psychiatric: Mood and affect are normal. Speech and behavior are normal.  ____________________________________________   LABS (all labs ordered are listed, but only abnormal results are displayed)  Labs Reviewed - No data to display ____________________________________________  EKG   ____________________________________________  RADIOLOGY  ED MD interpretation:   Official radiology report(s): Ct Head Wo Contrast  Result Date: 02/11/2018 CLINICAL DATA:  57 y/o  F; headache and blurry vision. EXAM: CT HEAD WITHOUT CONTRAST TECHNIQUE: Contiguous axial images were obtained from the base of the skull through the vertex without intravenous contrast. COMPARISON:  None. FINDINGS: Brain: No evidence of acute infarction, hemorrhage, hydrocephalus, extra-axial collection or mass lesion/mass effect. Vascular: Calcific atherosclerosis of carotid siphons. No hyperdense vessel. Skull: Normal. Negative for fracture or focal lesion. Sinuses/Orbits: Mild mucosal thickening of the right sphenoid sinus. Normal aeration of mastoid air cells. Visualized orbits are unremarkable. Other: None. IMPRESSION: Negative CT of the head. Electronically Signed   By: Kristine Garbe M.D.   On: 02/11/2018 03:59   Dg Hip Unilat W Or Wo Pelvis 2-3 Views Left  Result Date: 02/11/2018 CLINICAL DATA:  57 year old female with left hip pain. EXAM: DG HIP (WITH OR WITHOUT PELVIS) 2-3V LEFT COMPARISON:  None. FINDINGS: There is no acute fracture or dislocation. No significant arthritic changes. Lower lumbar fixation hardware partially visualized. Calcified granuloma in the subcutaneous soft tissues of the buttock.  IMPRESSION: Negative. Electronically Signed   By: Anner Crete M.D.   On: 02/11/2018 02:04    ____________________________________________   PROCEDURES  Procedure(s) performed:   Procedures  Critical Care performed:   ____________________________________________   INITIAL IMPRESSION / ASSESSMENT AND PLAN / ED COURSE   patient is now better. I will discharge her. I'm unsure what happened and what causes the pain she has not had any fever or any other complaints. I will have her take Tylenol if if it gets a little bit worse and return if it gets severe.        ____________________________________________   FINAL CLINICAL IMPRESSION(S) / ED DIAGNOSES  Final diagnoses:  Nonintractable episodic headache, unspecified headache type     ED Discharge Orders    None       Note:  This document was prepared using Dragon voice recognition software and may include unintentional dictation errors.    Nena Polio, MD 02/11/18 [phone]

## 2018-10-13 ENCOUNTER — Emergency Department
Admission: EM | Admit: 2018-10-13 | Discharge: 2018-10-14 | Disposition: A | Discharge status: Home / Self Care | Payer: Medicare Other | Attending: Emergency Medicine | Admitting: Emergency Medicine

## 2018-10-13 ENCOUNTER — Emergency Department: Payer: Medicare Other

## 2018-10-13 DIAGNOSIS — R109 Unspecified abdominal pain: Secondary | ICD-10-CM | POA: Insufficient documentation

## 2018-10-13 DIAGNOSIS — J40 Bronchitis, not specified as acute or chronic: Secondary | ICD-10-CM | POA: Diagnosis not present

## 2018-10-13 DIAGNOSIS — R079 Chest pain, unspecified: Secondary | ICD-10-CM | POA: Diagnosis present

## 2018-10-13 DIAGNOSIS — Z87891 Personal history of nicotine dependence: Secondary | ICD-10-CM | POA: Insufficient documentation

## 2018-10-13 DIAGNOSIS — G8929 Other chronic pain: Secondary | ICD-10-CM | POA: Insufficient documentation

## 2018-10-13 HISTORY — DX: Systemic involvement of connective tissue, unspecified: M35.9

## 2018-10-13 HISTORY — DX: Malignant (primary) neoplasm, unspecified: C80.1

## 2018-10-13 LAB — CBC
HCT: 46.9 % — ABNORMAL HIGH (ref 36.0–46.0)
Hemoglobin: 15.1 g/dL — ABNORMAL HIGH (ref 12.0–15.0)
MCH: 30.8 pg (ref 26.0–34.0)
MCHC: 32.2 g/dL (ref 30.0–36.0)
MCV: 95.5 fL (ref 80.0–100.0)
PLATELETS: 238 10*3/uL (ref 150–400)
RBC: 4.91 MIL/uL (ref 3.87–5.11)
RDW: 14.7 % (ref 11.5–15.5)
WBC: 8.8 10*3/uL (ref 4.0–10.5)
nRBC: 0 % (ref 0.0–0.2)

## 2018-10-13 LAB — BASIC METABOLIC PANEL
Anion gap: 9 (ref 5–15)
BUN: 19 mg/dL (ref 6–20)
CALCIUM: 9.3 mg/dL (ref 8.9–10.3)
CO2: 26 mmol/L (ref 22–32)
Chloride: 102 mmol/L (ref 98–111)
Creatinine, Ser: 0.74 mg/dL (ref 0.44–1.00)
GFR calc Af Amer: >60 mL/min (ref >60)
GFR calc non Af Amer: >60 mL/min (ref >60)
Glucose, Bld: 109 mg/dL — ABNORMAL HIGH (ref 70–99)
Potassium: 4 mmol/L (ref 3.5–5.1)
Sodium: 137 mmol/L (ref 135–145)

## 2018-10-13 LAB — TROPONIN I: Troponin I: <0.03 ng/mL (ref <0.03)

## 2018-10-13 MED ORDER — ONDANSETRON HCL 4 MG/2ML IJ SOLN
4.0000 mg | Freq: Once | INTRAMUSCULAR | Status: AC
  Administered 2018-10-14: 4 mg via INTRAVENOUS
  Filled 2018-10-13: qty 2

## 2018-10-13 MED ORDER — HYDROMORPHONE HCL 1 MG/ML IJ SOLN
0.5000 mg | Freq: Once | INTRAMUSCULAR | Status: AC
  Administered 2018-10-14: 0.5 mg via INTRAVENOUS
  Filled 2018-10-13: qty 1

## 2018-10-13 NOTE — ED Triage Notes (Signed)
Patient c/o left chest pain radiating down left arm described as sharp. Patient reports accompanying symptoms of nausea, SOB, and diarrhea.

## 2018-10-13 NOTE — ED Notes (Signed)
This RN, Laury Axon NT, Sarah NT all attempted blood specimen collection - unsuccessful. Lab called for phlebotomist.

## 2018-10-14 ENCOUNTER — Emergency Department: Payer: Medicare Other

## 2018-10-14 DIAGNOSIS — J40 Bronchitis, not specified as acute or chronic: Secondary | ICD-10-CM | POA: Diagnosis not present

## 2018-10-14 MED ORDER — IOPAMIDOL (ISOVUE-300) INJECTION 61%
100.0000 mL | Freq: Once | INTRAVENOUS | Status: AC | PRN
  Administered 2018-10-14: 100 mL via INTRAVENOUS

## 2018-10-14 MED ORDER — HYDROMORPHONE HCL 2 MG PO TABS
1.0000 mg | ORAL_TABLET | Freq: Once | ORAL | Status: AC
  Administered 2018-10-14: 1 mg via ORAL
  Filled 2018-10-14: qty 1

## 2018-10-14 MED ORDER — AZITHROMYCIN 500 MG PO TABS: 500.0000 mg | ORAL_TABLET | Freq: Every day | ORAL | 0 refills | Status: AC

## 2018-10-14 NOTE — ED Notes (Signed)
Patient transported to CT 

## 2018-10-16 NOTE — ED Provider Notes (Signed)
[facility] Emergency Department Provider Note __   First MD Initiated Contact with Patient 10/13/18 2319     (approximate)  I have reviewed the triage vital signs and the nursing notes.   HISTORY  Chief Complaint Chest Pain    HPI Betty Ferguson is a 58 y.o. female with below list of chronic medical conditions presents to the emergency department including pancreatic mass collagen vascular disease and fibromyalgia with left chest discomfort radiating down the left arm dyspnea and cough x1 week.  Patient also admits to left upper quadrant abdominal discomfort and loose stools.  Patient denies any fever.  Patient denies any vomiting.  Patient does admit to daily cigarette use   Past Medical History:  Diagnosis Date  . Cancer [facility])    pancreatic  . Collagen vascular disease ([place])   . Fibromyalgia   . Lupus ([place])     There are no active problems to display for this patient.   Past Surgical History:  Procedure Laterality Date  . BACK SURGERY    . CHOLECYSTECTOMY    . PANCREAS SURGERY    . PANCREATECTOMY    . ROTATOR CUFF REPAIR Right   . TOTAL ABDOMINAL HYSTERECTOMY      Prior to Admission medications   Medication Sig Start Date End Date Taking? Authorizing Provider  acetaminophen-codeine (TYLENOL #3) 300-30 MG tablet Take 1 tablet by mouth every 6 (six) hours as needed for moderate pain. 08/05/16   Menshew, Dannielle Karvonen, PA-C  azithromycin (ZITHROMAX) 500 MG tablet Take 1 tablet (500 mg total) by mouth daily for 3 days. Take 1 tablet daily for 3 days. 10/14/18 10/17/18  Gregor Hams, MD  diclofenac (VOLTAREN) 75 MG EC tablet Take 1 tablet (75 mg total) by mouth 2 (two) times daily. 06/01/17   Menshew, Dannielle Karvonen, PA-C  orphenadrine (NORFLEX) 100 MG tablet Take 1 tablet (100 mg total) by mouth 2 (two) times daily as needed for muscle spasms. 06/01/17   Menshew, Dannielle Karvonen, PA-C    Allergies Toradol [ketorolac tromethamine];  Doxycycline; and Morphine and related  No family history on file.  Social History Social History   Tobacco Use  . Smoking status: Former Research scientist (life sciences)  . Smokeless tobacco: Never Used  Substance Use Topics  . Alcohol use: No  . Drug use: No    Review of Systems Constitutional: No fever/chills Eyes: No visual changes. ENT: No sore throat. Cardiovascular: As if her chest discomfort Respiratory: Positive for cough and dyspnea Gastrointestinal: Positive for abdominal pain and diarrhea Genitourinary: Negative for dysuria. Musculoskeletal: Negative for neck pain.  Negative for back pain. Integumentary: Negative for rash. Neurological: Negative for headaches, focal weakness or numbness.  ____________________________________________   PHYSICAL EXAM:  VITAL SIGNS: ED Triage Vitals  Enc Vitals Group     BP 10/13/18 2330 138/82     Pulse Rate 10/13/18 2330 62     Resp 10/13/18 2330 19     Temp --      Temp src --      SpO2 10/13/18 2330 100 %     Weight 10/13/18 2047 72.1 kg (159 lb)     Height 10/13/18 2047 1.575 m (5\' 2" )     Head Circumference --      Peak Flow --      Pain Score 10/13/18 2047 8     Pain Loc --      Pain Edu? --      Excl. in [place]? --  Constitutional: Alert and oriented. Well appearing and in no acute distress. Eyes: Conjunctivae are normal. Mouth/Throat: Mucous membranes are moist.  Oropharynx non-erythematous. Neck: No stridor.   Cardiovascular: Normal rate, regular rhythm. Good peripheral circulation. Grossly normal heart sounds. Respiratory: Normal respiratory effort.  No retractions. Lungs CTAB. Gastrointestinal: Left upper quadrant tenderness to palpation.  No distention.  Musculoskeletal: No lower extremity tenderness nor edema. No gross deformities of extremities. Neurologic:  Normal speech and language. No gross focal neurologic deficits are appreciated.  Skin:  Skin is warm, dry and intact. No rash noted. Psychiatric: Mood and affect are  normal. Speech and behavior are normal.  ____________________________________________   LABS (all labs ordered are listed, but only abnormal results are displayed)  Labs Reviewed  BASIC METABOLIC PANEL - Abnormal; Notable for the following components:      Result Value   Glucose, Bld 109 (*)    All other components within normal limits  CBC - Abnormal; Notable for the following components:   Hemoglobin 15.1 (*)    HCT 46.9 (*)    All other components within normal limits  TROPONIN I   ____________________________________________  EKG  ED ECG REPORT I, [place] N Jim Philemon, the attending physician, personally viewed and interpreted this ECG.   Date: 10/13/2018  EKG Time: 8:44 PM  Rate: 72  Rhythm: Normal sinus rhythm  Axis: Normal  Intervals: Normal  ST&T Change: None  ____________________________________________  RADIOLOGY I, [place] N Anwyn Kriegel, personally viewed and evaluated these images (plain radiographs) as part of my medical decision making, as well as reviewing the written report by the radiologist.  ED MD interpretation: Chest x-ray revealed no active cardiopulmonary disease. CT abdomen and pelvis revealed postsurgical changes following resection of the body and tail of the pancreas 1.9 cm cystic collection in the pancreatic bed which is decreased since the previous study per radiologist.  Official radiology report(s): No results found.  ____________________________________________    Procedures   ____________________________________________   INITIAL IMPRESSION / ASSESSMENT AND PLAN / ED COURSE  As part of my medical decision making, I reviewed the following data within the electronic MEDICAL RECORD NUMBER  58 year old female presented with above-stated history and physical exam secondary to multiple medical complaints.  Regarding patient's chest pain shortness of breath and cough suspect possible bronchitis which patient was given azithromycin given known  cigarette use.  Patient states her abdominal pain is chronic in nature for which she takes Percocet at home.  Patient was given IV Dilaudid in the emergency department with improvement of her pain.  CT abdomen pelvis revealed no acute pathology.    ____________________________________________  FINAL CLINICAL IMPRESSION(S) / ED DIAGNOSES  Final diagnoses:  Bronchitis  Chronic abdominal pain     MEDICATIONS GIVEN DURING THIS VISIT:  Medications  HYDROmorphone (DILAUDID) injection 0.5 mg (0.5 mg Intravenous Given 10/14/18 0033)  ondansetron (ZOFRAN) injection 4 mg (4 mg Intravenous Given 10/14/18 0033)  iopamidol (ISOVUE-300) 61 % injection 100 mL (100 mLs Intravenous Contrast Given 10/14/18 0039)  HYDROmorphone (DILAUDID) tablet 1 mg (1 mg Oral Given 10/14/18 0225)     ED Discharge Orders         Ordered    azithromycin (ZITHROMAX) 500 MG tablet  Daily     10/14/18 0229           Note:  This document was prepared using Dragon voice recognition software and may include unintentional dictation errors.    Gregor Hams, MD 10/16/18 [phone]

## 2019-01-17 ENCOUNTER — Emergency Department
Admission: EM | Admit: 2019-01-17 | Discharge: 2019-01-17 | Disposition: A | Discharge status: Home / Self Care | Payer: Medicare Other | Attending: Emergency Medicine | Admitting: Emergency Medicine

## 2019-01-17 ENCOUNTER — Other Ambulatory Visit: Payer: Self-pay

## 2019-01-17 ENCOUNTER — Emergency Department: Payer: Medicare Other

## 2019-01-17 DIAGNOSIS — Z87891 Personal history of nicotine dependence: Secondary | ICD-10-CM | POA: Diagnosis not present

## 2019-01-17 DIAGNOSIS — Z79899 Other long term (current) drug therapy: Secondary | ICD-10-CM | POA: Insufficient documentation

## 2019-01-17 DIAGNOSIS — M25511 Pain in right shoulder: Secondary | ICD-10-CM | POA: Insufficient documentation

## 2019-01-17 DIAGNOSIS — Z8507 Personal history of malignant neoplasm of pancreas: Secondary | ICD-10-CM | POA: Diagnosis not present

## 2019-01-17 NOTE — ED Provider Notes (Signed)
[facility] Emergency Department Provider Note  ____________________________________________  Time seen: Approximately 9:57 PM  I have reviewed the triage vital signs and the nursing notes.   HISTORY  Chief Complaint Shoulder Pain    HPI Betty Ferguson is a 58 y.o. female presents to the emergency department with acute 10 out of 10 right shoulder pain.  Patient reports that she was lifting some meat out of the freezer when she felt a pop in her right shoulder and exquisite pain.  She denies numbness or tingling of the right lower extremity or subjective weakness.  Patient is crying and distraught in exam room.  Patient states "1 of my God I do for the pain".  She states that she has attempted no alleviating measures.  Patient states that she has had a prior right rotator cuff repair.        Past Medical History:  Diagnosis Date  . Cancer [facility])    pancreatic  . Collagen vascular disease ([place])   . Fibromyalgia   . Lupus ([place])     There are no active problems to display for this patient.   Past Surgical History:  Procedure Laterality Date  . BACK SURGERY    . CHOLECYSTECTOMY    . PANCREAS SURGERY    . PANCREATECTOMY    . ROTATOR CUFF REPAIR Right   . TOTAL ABDOMINAL HYSTERECTOMY      Prior to Admission medications   Medication Sig Start Date End Date Taking? Authorizing Provider  acetaminophen-codeine (TYLENOL #3) 300-30 MG tablet Take 1 tablet by mouth every 6 (six) hours as needed for moderate pain. 08/05/16   Menshew, Dannielle Karvonen, PA-C  diclofenac (VOLTAREN) 75 MG EC tablet Take 1 tablet (75 mg total) by mouth 2 (two) times daily. 06/01/17   Menshew, Dannielle Karvonen, PA-C  orphenadrine (NORFLEX) 100 MG tablet Take 1 tablet (100 mg total) by mouth 2 (two) times daily as needed for muscle spasms. 06/01/17   Menshew, Dannielle Karvonen, PA-C    Allergies Toradol [ketorolac tromethamine]; Doxycycline; and Morphine and related  No family history on  file.  Social History Social History   Tobacco Use  . Smoking status: Former Research scientist (life sciences)  . Smokeless tobacco: Never Used  Substance Use Topics  . Alcohol use: No  . Drug use: No     Review of Systems  Constitutional: No fever/chills Eyes: No visual changes. No discharge ENT: No upper respiratory complaints. Cardiovascular: no chest pain. Respiratory: no cough. No SOB. Gastrointestinal: No abdominal pain.  No nausea, no vomiting.  No diarrhea.  No constipation. Musculoskeletal: Patient has right shoulder pain. Skin: Negative for rash, abrasions, lacerations, ecchymosis. Neurological: Negative for headaches, focal weakness or numbness.   ____________________________________________   PHYSICAL EXAM:  VITAL SIGNS: ED Triage Vitals  Enc Vitals Group     BP 01/17/19 2045 (!) 143/86     Pulse Rate 01/17/19 2045 77     Resp 01/17/19 2045 16     Temp 01/17/19 2045 97.8 F (36.6 C)     Temp Source 01/17/19 2045 Oral     SpO2 01/17/19 2045 99 %     Weight 01/17/19 2043 147 lb (66.7 kg)     Height 01/17/19 2043 5\' 2"  (1.575 m)     Head Circumference --      Peak Flow --      Pain Score 01/17/19 2043 10     Pain Loc --      Pain Edu? --  Excl. in [place]? --      Constitutional: Alert and oriented. Well appearing and in no acute distress. Eyes: Conjunctivae are normal. PERRL. EOMI. Head: Atraumatic. Cardiovascular: Normal rate, regular rhythm. Normal S1 and S2.  Good peripheral circulation. Respiratory: Normal respiratory effort without tachypnea or retractions. Lungs CTAB. Good air entry to the bases with no decreased or absent breath sounds. Musculoskeletal: Patient is unable to perform full range of motion at the right shoulder secondary to pain.  Provocative testing is limited due to pain.  Full range of motion at the right elbow and right wrist.  Patient can move all 5 right fingers.  Palpable radial pulse, right. Neurologic:  Normal speech and language. No gross focal  neurologic deficits are appreciated.  Skin:  Skin is warm, dry and intact. No rash noted. Psychiatric: Mood and affect are normal. Speech and behavior are normal. Patient exhibits appropriate insight and judgement.   ____________________________________________   LABS (all labs ordered are listed, but only abnormal results are displayed)  Labs Reviewed - No data to display ____________________________________________  EKG   ____________________________________________  RADIOLOGY I personally viewed and evaluated these images as part of my medical decision making, as well as reviewing the written report by the radiologist.  Dg Shoulder Right  Result Date: 01/17/2019 CLINICAL DATA:  58 year old female with right shoulder pain EXAM: RIGHT SHOULDER - 2+ VIEW COMPARISON:  None FINDINGS: Glenohumeral joint appears congruent. No acute fracture the proximal humerus. Degenerative changes of the acromioclavicular joint. Unremarkable scapular Y-view. No radiopaque foreign body or focal soft tissue swelling IMPRESSION: Negative for acute bony abnormality Electronically Signed   By: Corrie Mckusick D.O.   On: 01/17/2019 21:17    ____________________________________________    PROCEDURES  Procedure(s) performed:    Procedures    Medications - No data to display   ____________________________________________   INITIAL IMPRESSION / ASSESSMENT AND PLAN / ED COURSE  Pertinent labs & imaging results that were available during my care of the patient were reviewed by me and considered in my medical decision making (see chart for details).  Review of the [place] CSRS was performed in accordance of the [place] prior to dispensing any controlled drugs.           Assessment and plan Right shoulder pain Patient presents to the emergency department with acute right shoulder pain after trying to move meat from her freezer.  On physical exam, patient was mildly hypertensive and crying.  It was  difficult to perform a complete physical exam due to patient's pain.  Differential diagnosis included right rotator cuff tear, labral tear, rotator cuff tendinitis and biceps tendon rupture.  X-ray examination of the right shoulder was obtained which revealed no acute abnormality.  Patient only wanted narcotics in the emergency department she reports that she is allergic to anti-inflammatories.  Review of the [place] drug database occurred during this emergency department encounter and patient filled a 30-day supply of Percocet on 01/12/2019.  I offered patient a sling in the emergency department for comfort and advised patient to continue taking her chronic pain medicine for acute right shoulder pain.  She was given a referral to orthopedics, Dr. Harlow Mares.  Ice application was also recommended.  All patient questions were answered prior to discharge.   ____________________________________________  FINAL CLINICAL IMPRESSION(S) / ED DIAGNOSES  Final diagnoses:  Acute pain of right shoulder      NEW MEDICATIONS STARTED DURING THIS VISIT:  ED Discharge Orders    None  This chart was dictated using voice recognition software/Dragon. Despite best efforts to proofread, errors can occur which can change the meaning. Any change was purely unintentional.    Lannie Fields, PA-C 01/17/19 2200    Carrie Mew, MD 01/17/19 2312

## 2019-01-17 NOTE — ED Triage Notes (Signed)
Pt to ED via EMS from home. Pt c/o right shoulder pain. Pt states she was taking out trash and swung her arm around and felt a pop in her shoulder. Pt states she has had rotator cuff surgery on same arm xyears ago.

## 2019-03-11 ENCOUNTER — Other Ambulatory Visit: Payer: Self-pay

## 2019-03-11 DIAGNOSIS — R079 Chest pain, unspecified: Secondary | ICD-10-CM | POA: Insufficient documentation

## 2019-03-11 DIAGNOSIS — Z5321 Procedure and treatment not carried out due to patient leaving prior to being seen by health care provider: Secondary | ICD-10-CM | POA: Diagnosis not present

## 2019-03-11 NOTE — ED Triage Notes (Signed)
Patient reports symptoms began about 2 hours prior to arrival.  Reports pain to left chest that radiated down and around to left axilla.

## 2019-03-12 ENCOUNTER — Emergency Department
Admission: EM | Admit: 2019-03-12 | Discharge: 2019-03-12 | Disposition: A | Discharge status: Home / Self Care | Payer: Medicare Other | Attending: Emergency Medicine | Admitting: Emergency Medicine

## 2019-03-12 LAB — BASIC METABOLIC PANEL
Anion gap: 6 (ref 5–15)
BUN: 10 mg/dL (ref 6–20)
CO2: 25 mmol/L (ref 22–32)
Calcium: 9.1 mg/dL (ref 8.9–10.3)
Chloride: 109 mmol/L (ref 98–111)
Creatinine, Ser: 0.67 mg/dL (ref 0.44–1.00)
GFR calc Af Amer: >60 mL/min (ref >60)
GFR calc non Af Amer: >60 mL/min (ref >60)
Glucose, Bld: 98 mg/dL (ref 70–99)
Potassium: 3.8 mmol/L (ref 3.5–5.1)
Sodium: 140 mmol/L (ref 135–145)

## 2019-03-12 LAB — CBC
HCT: 44.1 % (ref 36.0–46.0)
Hemoglobin: 14 g/dL (ref 12.0–15.0)
MCH: 31.3 pg (ref 26.0–34.0)
MCHC: 31.7 g/dL (ref 30.0–36.0)
MCV: 98.4 fL (ref 80.0–100.0)
Platelets: 139 10*3/uL — ABNORMAL LOW (ref 150–400)
RBC: 4.48 MIL/uL (ref 3.87–5.11)
RDW: 15 % (ref 11.5–15.5)
WBC: 7.6 10*3/uL (ref 4.0–10.5)
nRBC: 0 % (ref 0.0–0.2)

## 2019-03-12 LAB — TROPONIN I: Troponin I: <0.03 ng/mL (ref <0.03)

## 2019-12-17 ENCOUNTER — Emergency Department: Payer: Medicare Other

## 2019-12-17 ENCOUNTER — Encounter: Payer: Self-pay | Admitting: Emergency Medicine

## 2019-12-17 ENCOUNTER — Emergency Department
Admission: EM | Admit: 2019-12-17 | Discharge: 2019-12-17 | Disposition: A | Discharge status: Home / Self Care | Payer: Medicare Other | Attending: Student in an Organized Health Care Education/Training Program | Admitting: Student in an Organized Health Care Education/Training Program

## 2019-12-17 ENCOUNTER — Other Ambulatory Visit: Payer: Self-pay

## 2019-12-17 DIAGNOSIS — R062 Wheezing: Secondary | ICD-10-CM | POA: Diagnosis present

## 2019-12-17 DIAGNOSIS — J189 Pneumonia, unspecified organism: Secondary | ICD-10-CM | POA: Diagnosis not present

## 2019-12-17 DIAGNOSIS — Z87891 Personal history of nicotine dependence: Secondary | ICD-10-CM | POA: Diagnosis not present

## 2019-12-17 DIAGNOSIS — U071 COVID-19: Secondary | ICD-10-CM | POA: Insufficient documentation

## 2019-12-17 HISTORY — DX: Personal history of malignant neoplasm of liver: Z85.05

## 2019-12-17 HISTORY — DX: Rheumatoid arthritis, unspecified: M06.9

## 2019-12-17 LAB — CBC WITH DIFFERENTIAL/PLATELET
Abs Immature Granulocytes: 0.02 10*3/uL (ref 0.00–0.07)
Basophils Absolute: 0 10*3/uL (ref 0.0–0.1)
Basophils Relative: 0 %
Eosinophils Absolute: 0.1 10*3/uL (ref 0.0–0.5)
Eosinophils Relative: 1 %
HCT: 44.1 % (ref 36.0–46.0)
Hemoglobin: 14 g/dL (ref 12.0–15.0)
Immature Granulocytes: 0 %
Lymphocytes Relative: 32 %
Lymphs Abs: 2 10*3/uL (ref 0.7–4.0)
MCH: 28 pg (ref 26.0–34.0)
MCHC: 31.7 g/dL (ref 30.0–36.0)
MCV: 88.2 fL (ref 80.0–100.0)
Monocytes Absolute: 0.4 10*3/uL (ref 0.1–1.0)
Monocytes Relative: 7 %
Neutro Abs: 3.7 10*3/uL (ref 1.7–7.7)
Neutrophils Relative %: 60 %
Platelets: 186 10*3/uL (ref 150–400)
RBC: 5 MIL/uL (ref 3.87–5.11)
RDW: 14.6 % (ref 11.5–15.5)
WBC: 6.3 10*3/uL (ref 4.0–10.5)
nRBC: 0 % (ref 0.0–0.2)

## 2019-12-17 LAB — COMPREHENSIVE METABOLIC PANEL
ALT: 15 U/L (ref 0–44)
AST: 16 U/L (ref 15–41)
Albumin: 3.4 g/dL — ABNORMAL LOW (ref 3.5–5.0)
Alkaline Phosphatase: 73 U/L (ref 38–126)
Anion gap: 9 (ref 5–15)
BUN: 21 mg/dL — ABNORMAL HIGH (ref 6–20)
CO2: 25 mmol/L (ref 22–32)
Calcium: 8.5 mg/dL — ABNORMAL LOW (ref 8.9–10.3)
Chloride: 103 mmol/L (ref 98–111)
Creatinine, Ser: 0.88 mg/dL (ref 0.44–1.00)
GFR calc Af Amer: >60 mL/min (ref >60)
GFR calc non Af Amer: >60 mL/min (ref >60)
Glucose, Bld: 182 mg/dL — ABNORMAL HIGH (ref 70–99)
Potassium: 3.7 mmol/L (ref 3.5–5.1)
Sodium: 137 mmol/L (ref 135–145)
Total Bilirubin: 0.5 mg/dL (ref 0.3–1.2)
Total Protein: 6.3 g/dL — ABNORMAL LOW (ref 6.5–8.1)

## 2019-12-17 LAB — TROPONIN I (HIGH SENSITIVITY): Troponin I (High Sensitivity): 3 ng/L (ref <18)

## 2019-12-17 MED ORDER — ONDANSETRON 4 MG PO TBDP
4.0000 mg | ORAL_TABLET | Freq: Once | ORAL | Status: AC
  Administered 2019-12-17: 4 mg via ORAL
  Filled 2019-12-17: qty 1

## 2019-12-17 MED ORDER — MELOXICAM 7.5 MG PO TABS
7.5000 mg | ORAL_TABLET | Freq: Once | ORAL | Status: AC
  Administered 2019-12-17: 7.5 mg via ORAL
  Filled 2019-12-17: qty 1

## 2019-12-17 MED ORDER — BACLOFEN 10 MG PO TABS
5.0000 mg | ORAL_TABLET | Freq: Three times a day (TID) | ORAL | Status: DC
  Filled 2019-12-17 (×2): qty 0.5

## 2019-12-17 MED ORDER — ONDANSETRON 4 MG PO TBDP: 4.0000 mg | ORAL_TABLET | Freq: Three times a day (TID) | ORAL | 0 refills | Status: DC | PRN

## 2019-12-17 MED ORDER — AZITHROMYCIN 500 MG PO TABS
500.0000 mg | ORAL_TABLET | Freq: Once | ORAL | Status: AC
  Administered 2019-12-17: 500 mg via ORAL
  Filled 2019-12-17: qty 1

## 2019-12-17 MED ORDER — ALBUTEROL SULFATE HFA 108 (90 BASE) MCG/ACT IN AERS
1.0000 | INHALATION_SPRAY | Freq: Once | RESPIRATORY_TRACT | Status: AC
  Administered 2019-12-17: 1 via RESPIRATORY_TRACT
  Filled 2019-12-17: qty 6.7

## 2019-12-17 MED ORDER — AZITHROMYCIN 250 MG PO TABS: ORAL_TABLET | ORAL | 0 refills | Status: DC

## 2019-12-17 MED ORDER — DEXAMETHASONE SODIUM PHOSPHATE 10 MG/ML IJ SOLN
10.0000 mg | Freq: Once | INTRAMUSCULAR | Status: AC
  Administered 2019-12-17: 10 mg via INTRAMUSCULAR
  Filled 2019-12-17: qty 1

## 2019-12-17 NOTE — ED Triage Notes (Signed)
See triage note  States she developed COVID sxs' last Sunday     Then was tested on Thursday  Afebrile on   States she just doesn't feel and she lost her taste and smell

## 2019-12-17 NOTE — ED Triage Notes (Addendum)
Pt here for covid sx.  Dx last Tuesday, sx started Sunday before.  C/o nausea, headaches that are not relieved with OTC meds, loss of taste and smell, and fatigue.  Pt reports she did outpatient infusion last Friday but felt worse after.  VSS in triage.  Also c/o cough.

## 2019-12-17 NOTE — ED Notes (Signed)
Pt now stating that her chest is a little tight.

## 2019-12-17 NOTE — ED Provider Notes (Signed)
[facility] Emergency Department Provider Note  ____________________________________________  Time seen: Approximately 5:52 PM  I have reviewed the triage vital signs and the nursing notes.   HISTORY  Chief Complaint COVID    HPI Betty Ferguson is a 59 y.o. female that presents to the emergency department for evaluation of COVID-19 for 1 week.  Patient has had headache, nasal congestion, productive cough with yellow sputum, shortness of breath, nausea, fatigue, body aches for 1 week.  Patient states that 3 days ago she lost her sense of smell and taste.  She can hear herself wheezing.  Patient tested positive for COVID-19 last Tuesday.  She went to [place] last Thursday for an infusion.  Patient states that she felt worse following the infusion.  She has continued to feel fatigued.  She takes 5mg  daily prednisone. No vomiting, abdominal pain, diarrhea.   Past Medical History:  Diagnosis Date  . Cancer [facility])    pancreatic  . Collagen vascular disease ([place])   . Fibromyalgia   . H/O liver cancer   . Lupus ([place])   . Rheumatoid arthritis ([place])     There are no problems to display for this patient.   Past Surgical History:  Procedure Laterality Date  . BACK SURGERY    . CHOLECYSTECTOMY    . PANCREAS SURGERY    . PANCREATECTOMY    . ROTATOR CUFF REPAIR Right   . TOTAL ABDOMINAL HYSTERECTOMY      Prior to Admission medications   Medication Sig Start Date End Date Taking? Authorizing Provider  azithromycin (ZITHROMAX Z-PAK) 250 MG tablet Take 2 tablets (500 mg) on  Day 1,  followed by 1 tablet (250 mg) once daily on Days 2 through 5. 12/18/19   Laban Emperor, PA-C  ondansetron (ZOFRAN ODT) 4 MG disintegrating tablet Take 1 tablet (4 mg total) by mouth every 8 (eight) hours as needed for nausea or vomiting. 12/17/19   Laban Emperor, PA-C    Allergies Toradol [ketorolac tromethamine], Doxycycline, and Morphine and related  History reviewed. No pertinent  family history.  Social History Social History   Tobacco Use  . Smoking status: Former Research scientist (life sciences)  . Smokeless tobacco: Never Used  Substance Use Topics  . Alcohol use: No  . Drug use: No     Review of Systems  Constitutional: Positive for chills. Eyes: No visual changes. No discharge. ENT: Positive for congestion and rhinorrhea. Cardiovascular: Positive for chest tightness. Respiratory: Positive for cough and SOB. Gastrointestinal: No abdominal pain.  Positive for nausea.  No vomiting.  No diarrhea.  No constipation. Musculoskeletal: Positive for body aches. Skin: Negative for rash, abrasions, lacerations, ecchymosis. Neurological: Positive for headache.   ____________________________________________   PHYSICAL EXAM:  VITAL SIGNS: ED Triage Vitals [12/17/19 1636]  Enc Vitals Group     BP (!) 150/74     Pulse Rate 65     Resp 18     Temp 97.7 F (36.5 C)     Temp Source Oral     SpO2 99 %     Weight 175 lb (79.4 kg)     Height 5\' 2"  (1.575 m)     Head Circumference      Peak Flow      Pain Score 10     Pain Loc      Pain Edu?      Excl. in [place]?      Constitutional: Alert and oriented. Well appearing and in no acute distress. Eyes: Conjunctivae are normal.  PERRL. EOMI. No discharge. Head: Atraumatic. ENT: No frontal and maxillary sinus tenderness.      Ears: Tympanic membranes pearly gray with good landmarks. No discharge.      Nose: Mild congestion/rhinnorhea.      Mouth/Throat: Mucous membranes are moist. Oropharynx non-erythematous. Tonsils not enlarged. No exudates. Uvula midline. Neck: No stridor.   Hematological/Lymphatic/Immunilogical: No cervical lymphadenopathy. Cardiovascular: Normal rate, regular rhythm.  Good peripheral circulation. Respiratory: Normal respiratory effort without tachypnea or retractions. Lungs CTAB. Good air entry to the bases with no decreased or absent breath sounds. Gastrointestinal: Bowel sounds 4 quadrants. Soft and nontender  to palpation. No guarding or rigidity. No palpable masses. No distention. Musculoskeletal: Full range of motion to all extremities. No gross deformities appreciated. Neurologic:  Normal speech and language. No gross focal neurologic deficits are appreciated.  Skin:  Skin is warm, dry and intact. No rash noted. Psychiatric: Mood and affect are normal. Speech and behavior are normal. Patient exhibits appropriate insight and judgement.   ____________________________________________   LABS (all labs ordered are listed, but only abnormal results are displayed)  Labs Reviewed  COMPREHENSIVE METABOLIC PANEL - Abnormal; Notable for the following components:      Result Value   Glucose, Bld 182 (*)    BUN 21 (*)    Calcium 8.5 (*)    Total Protein 6.3 (*)    Albumin 3.4 (*)    All other components within normal limits  CBC WITH DIFFERENTIAL/PLATELET  TROPONIN I (HIGH SENSITIVITY)  TROPONIN I (HIGH SENSITIVITY)   ____________________________________________  EKG  SR ____________________________________________  RADIOLOGY Robinette Haines, personally viewed and evaluated these images (plain radiographs) as part of my medical decision making, as well as reviewing the written report by the radiologist.  DG Chest 2 View  Result Date: 12/17/2019 CLINICAL DATA:  Chest pain. COVID positive. Symptom onset 1 week ago. EXAM: CHEST - 2 VIEW COMPARISON:  Chest radiograph 10/13/2018 FINDINGS: The cardiomediastinal contours are normal. Minimal vague opacities at the lung bases. Pulmonary vasculature is normal. No confluent consolidation, pleural effusion, or pneumothorax. No acute osseous abnormalities are seen. IMPRESSION: Minimal vague opacities at the lung bases, may reflect atelectasis or pneumonia in the setting of COVID-19. Electronically Signed   By: Keith Rake M.D.   On: 12/17/2019 17:24    ____________________________________________    PROCEDURES  Procedure(s) performed:     Procedures    Medications  ondansetron (ZOFRAN-ODT) disintegrating tablet 4 mg (4 mg Oral Given 12/17/19 1805)  azithromycin (ZITHROMAX) tablet 500 mg (500 mg Oral Given 12/17/19 1805)  meloxicam (MOBIC) tablet 7.5 mg (7.5 mg Oral Given 12/17/19 1805)  albuterol (VENTOLIN HFA) 108 (90 Base) MCG/ACT inhaler 1 puff (1 puff Inhalation Given 12/17/19 1850)  dexamethasone (DECADRON) injection 10 mg (10 mg Intramuscular Given 12/17/19 1806)     ____________________________________________   INITIAL IMPRESSION / ASSESSMENT AND PLAN / ED COURSE  Pertinent labs & imaging results that were available during my care of the patient were reviewed by me and considered in my medical decision making (see chart for details).  Review of the [place] CSRS was performed in accordance of the [place] prior to dispensing any controlled drugs.     Patient's diagnosis is consistent with COVID-19 pneumonia. Vital signs and exam are reassuring.  Chest x-ray consistent with minimal vague opacities at the lung bases.  Normal sinus rhythm on EKG.  CMP remarkable for glucose 182, BUN 21, calcium 8.5, protein 6.3, albumin 3.4.  CBC within normal limits.  Troponin  within reference range.  Oxygen saturations 99%-100% at rest.  Oxygen saturations very briefly dropped to low to mid 90's with talking while ambulating but immediately increased back to 100%.  Patient was given azithromycin for Covid pneumonia.  She was given Decadron and albuterol inhaler for wheezing and shortness of breath.  She was given Zofran for nausea.  She was given Mobic for headache.  Patient appears well and is staying well hydrated. Patient feels comfortable going home. Patient will be discharged home with prescriptions for azithromycin. Patient is to follow up with primary care as needed or otherwise directed. Patient is given ED precautions to return to the ED for any worsening or new symptoms.   Kaiesha Masud was evaluated in Emergency Department on  12/17/2019 for the symptoms described in the history of present illness. She was evaluated in the context of the global COVID-19 pandemic, which necessitated consideration that the patient might be at risk for infection with the SARS-CoV-2 virus that causes COVID-19. Institutional protocols and algorithms that pertain to the evaluation of patients at risk for COVID-19 are in a state of rapid change based on information released by regulatory bodies including the CDC and federal and state organizations. These policies and algorithms were followed during the patient's care in the ED.  ____________________________________________  FINAL CLINICAL IMPRESSION(S) / ED DIAGNOSES  Final diagnoses:  Pneumonia due to COVID-19 virus      NEW MEDICATIONS STARTED DURING THIS VISIT:  ED Discharge Orders         Ordered    azithromycin (ZITHROMAX Z-PAK) 250 MG tablet     12/17/19 1831    ondansetron (ZOFRAN ODT) 4 MG disintegrating tablet  Every 8 hours PRN     12/17/19 1831              This chart was dictated using voice recognition software/Dragon. Despite best efforts to proofread, errors can occur which can change the meaning. Any change was purely unintentional.    Laban Emperor, PA-C 12/17/19 1852    Merlyn Lot, MD 12/17/19 1901

## 2019-12-17 NOTE — Discharge Instructions (Signed)
Your chest x-ray shows that you may be starting to develop a Covid pneumonia.  Your lab work is reassuring.  Please begin antibiotics tomorrow.  You can use the albuterol inhaler every 4-6 hours for shortness of breath and wheezing.  You can take the Zofran for nausea.  Please be sure to drink plenty of fluids.  Please call your primary care provider in the morning for a virtual follow-up appointment in 48 hours.  Please return the emergency department for worsening of symptoms.

## 2020-01-21 ENCOUNTER — Other Ambulatory Visit: Payer: Self-pay

## 2020-01-21 ENCOUNTER — Encounter: Payer: Self-pay | Admitting: *Deleted

## 2020-01-21 DIAGNOSIS — Y999 Unspecified external cause status: Secondary | ICD-10-CM | POA: Insufficient documentation

## 2020-01-21 DIAGNOSIS — Y939 Activity, unspecified: Secondary | ICD-10-CM | POA: Insufficient documentation

## 2020-01-21 DIAGNOSIS — X58XXXA Exposure to other specified factors, initial encounter: Secondary | ICD-10-CM | POA: Insufficient documentation

## 2020-01-21 DIAGNOSIS — Y929 Unspecified place or not applicable: Secondary | ICD-10-CM | POA: Insufficient documentation

## 2020-01-21 DIAGNOSIS — R110 Nausea: Secondary | ICD-10-CM | POA: Diagnosis not present

## 2020-01-21 DIAGNOSIS — S39012A Strain of muscle, fascia and tendon of lower back, initial encounter: Secondary | ICD-10-CM | POA: Insufficient documentation

## 2020-01-21 DIAGNOSIS — Z87891 Personal history of nicotine dependence: Secondary | ICD-10-CM | POA: Insufficient documentation

## 2020-01-21 DIAGNOSIS — Z8507 Personal history of malignant neoplasm of pancreas: Secondary | ICD-10-CM | POA: Diagnosis not present

## 2020-01-21 DIAGNOSIS — S3992XA Unspecified injury of lower back, initial encounter: Secondary | ICD-10-CM | POA: Diagnosis present

## 2020-01-21 DIAGNOSIS — R030 Elevated blood-pressure reading, without diagnosis of hypertension: Secondary | ICD-10-CM | POA: Diagnosis not present

## 2020-01-21 DIAGNOSIS — Z8505 Personal history of malignant neoplasm of liver: Secondary | ICD-10-CM | POA: Insufficient documentation

## 2020-01-21 LAB — CBC
HCT: 41.7 % (ref 36.0–46.0)
Hemoglobin: 13.1 g/dL (ref 12.0–15.0)
MCH: 28.2 pg (ref 26.0–34.0)
MCHC: 31.4 g/dL (ref 30.0–36.0)
MCV: 89.9 fL (ref 80.0–100.0)
Platelets: 178 10*3/uL (ref 150–400)
RBC: 4.64 MIL/uL (ref 3.87–5.11)
RDW: 14.7 % (ref 11.5–15.5)
WBC: 7.4 10*3/uL (ref 4.0–10.5)
nRBC: 0 % (ref 0.0–0.2)

## 2020-01-21 LAB — URINALYSIS, COMPLETE (UACMP) WITH MICROSCOPIC
Bacteria, UA: NONE SEEN
Bilirubin Urine: NEGATIVE
Glucose, UA: NEGATIVE mg/dL
Hgb urine dipstick: NEGATIVE
Ketones, ur: NEGATIVE mg/dL
Leukocytes,Ua: NEGATIVE
Nitrite: NEGATIVE
Protein, ur: NEGATIVE mg/dL
Specific Gravity, Urine: 1.002 — ABNORMAL LOW (ref 1.005–1.030)
pH: 6 (ref 5.0–8.0)

## 2020-01-21 NOTE — ED Triage Notes (Signed)
Pt has lower back pain. No known injury to back.  Pt reports nausea.  No hx kidney stones.  Denies painful urination.  Pt alert  Speech clear.

## 2020-01-22 ENCOUNTER — Emergency Department: Payer: Medicare Other

## 2020-01-22 ENCOUNTER — Emergency Department
Admission: EM | Admit: 2020-01-22 | Discharge: 2020-01-22 | Disposition: A | Discharge status: Home / Self Care | Payer: Medicare Other | Attending: Emergency Medicine | Admitting: Emergency Medicine

## 2020-01-22 DIAGNOSIS — S39012A Strain of muscle, fascia and tendon of lower back, initial encounter: Secondary | ICD-10-CM

## 2020-01-22 LAB — COMPREHENSIVE METABOLIC PANEL
ALT: 17 U/L (ref 0–44)
AST: 19 U/L (ref 15–41)
Albumin: 3.6 g/dL (ref 3.5–5.0)
Alkaline Phosphatase: 60 U/L (ref 38–126)
Anion gap: 7 (ref 5–15)
BUN: 16 mg/dL (ref 6–20)
CO2: 26 mmol/L (ref 22–32)
Calcium: 9 mg/dL (ref 8.9–10.3)
Chloride: 105 mmol/L (ref 98–111)
Creatinine, Ser: 0.78 mg/dL (ref 0.44–1.00)
GFR calc Af Amer: >60 mL/min (ref >60)
GFR calc non Af Amer: >60 mL/min (ref >60)
Glucose, Bld: 133 mg/dL — ABNORMAL HIGH (ref 70–99)
Potassium: 4.1 mmol/L (ref 3.5–5.1)
Sodium: 138 mmol/L (ref 135–145)
Total Bilirubin: 0.3 mg/dL (ref 0.3–1.2)
Total Protein: 6.4 g/dL — ABNORMAL LOW (ref 6.5–8.1)

## 2020-01-22 LAB — LIPASE, BLOOD: Lipase: 20 U/L (ref 11–51)

## 2020-01-22 MED ORDER — ORPHENADRINE CITRATE 30 MG/ML IJ SOLN
60.0000 mg | Freq: Two times a day (BID) | INTRAMUSCULAR | Status: DC
  Administered 2020-01-22: 60 mg via INTRAMUSCULAR
  Filled 2020-01-22: qty 2

## 2020-01-22 MED ORDER — TIZANIDINE HCL 4 MG PO TABS: 4.0000 mg | ORAL_TABLET | Freq: Four times a day (QID) | ORAL | 0 refills | Status: DC | PRN

## 2020-01-22 MED ORDER — ONDANSETRON 8 MG PO TBDP
8.0000 mg | ORAL_TABLET | Freq: Once | ORAL | Status: AC
  Administered 2020-01-22: 8 mg via ORAL
  Filled 2020-01-22: qty 1

## 2020-01-22 MED ORDER — HYDROMORPHONE HCL 1 MG/ML IJ SOLN
1.0000 mg | Freq: Once | INTRAMUSCULAR | Status: AC
  Administered 2020-01-22: 08:00:00 1 mg via INTRAMUSCULAR
  Filled 2020-01-22: qty 1

## 2020-01-22 NOTE — Discharge Instructions (Addendum)
Follow discharge care instruction continue previous medications.  Stop muscle relaxers as directed.

## 2020-01-22 NOTE — ED Provider Notes (Signed)
[facility] Emergency Department Provider Note   ____________________________________________   First MD Initiated Contact with Patient 01/22/20 [phone]     (approximate)  I have reviewed the triage vital signs and the nursing notes.   HISTORY  Chief Complaint Back Pain    HPI Betty Ferguson is a 59 y.o. female patient complain of bilateral back pain which began last night.  Patient denies provoking incident for complaint.  Patient also state nausea.  Patient denies dysuria or urinary frequency.  No history of kidney stones.  Patient has history of chronic back pain.  Patient also had fusion of the lumbar spine.  Patient is followed under pain management.  Patient rates the pain as a 10/10.  Patient state has taken pain medicine as directed.         Past Medical History:  Diagnosis Date  . Cancer [facility])    pancreatic  . Collagen vascular disease ([place])   . Fibromyalgia   . H/O liver cancer   . Lupus ([place])   . Rheumatoid arthritis ([place])     There are no problems to display for this patient.   Past Surgical History:  Procedure Laterality Date  . BACK SURGERY    . CHOLECYSTECTOMY    . PANCREAS SURGERY    . PANCREATECTOMY    . ROTATOR CUFF REPAIR Right   . TOTAL ABDOMINAL HYSTERECTOMY      Prior to Admission medications   Medication Sig Start Date End Date Taking? Authorizing Provider  azithromycin (ZITHROMAX Z-PAK) 250 MG tablet Take 2 tablets (500 mg) on  Day 1,  followed by 1 tablet (250 mg) once daily on Days 2 through 5. 12/18/19   Laban Emperor, PA-C  ondansetron (ZOFRAN ODT) 4 MG disintegrating tablet Take 1 tablet (4 mg total) by mouth every 8 (eight) hours as needed for nausea or vomiting. 12/17/19   Laban Emperor, PA-C  tiZANidine (ZANAFLEX) 4 MG tablet Take 1 tablet (4 mg total) by mouth every 6 (six) hours as needed for muscle spasms. 01/22/20   Sable Feil, PA-C    Allergies Toradol [ketorolac tromethamine], Doxycycline, and  Morphine and related  No family history on file.  Social History Social History   Tobacco Use  . Smoking status: Former Research scientist (life sciences)  . Smokeless tobacco: Never Used  Substance Use Topics  . Alcohol use: No  . Drug use: No    Review of Systems Constitutional: No fever/chills Eyes: No visual changes. ENT: No sore throat. Cardiovascular: Denies chest pain. Respiratory: Denies shortness of breath. Gastrointestinal: No abdominal pain.  No nausea, no vomiting.  No diarrhea.  No constipation. Genitourinary: Negative for dysuria. Musculoskeletal: Positive for back pain. Skin: Negative for rash. Neurological: Negative for headaches, focal weakness or numbness. Endocrine:  Lupus and rheumatoid arthritis Allergic/Immunilogical: Toradol and doxycycline. ____________________________________________   PHYSICAL EXAM:  VITAL SIGNS: ED Triage Vitals [01/21/20 2322]  Enc Vitals Group     BP (!) 154/85     Pulse Rate 67     Resp 20     Temp 98.4 F (36.9 C)     Temp Source Oral     SpO2 100 %     Weight 176 lb (79.8 kg)     Height 5\' 2"  (1.575 m)     Head Circumference      Peak Flow      Pain Score 10     Pain Loc      Pain Edu?  Excl. in [place]?     Constitutional: Alert and oriented.  Moderate distress.   Cardiovascular: Normal rate, regular rhythm. Grossly normal heart sounds.  Good peripheral circulation.  Elevated blood pressure. Respiratory: Normal respiratory effort.  No retractions. Lungs CTAB. Gastrointestinal: Soft and nontender. No distention. No abdominal bruits. No CVA tenderness. Genitourinary: Deferred Musculoskeletal: Lumbar spinal deformity.  Patient is moderate guarding bilateral paraspinal muscle area.  No lower extremity tenderness nor edema.  No joint effusions. Neurologic:  Normal speech and language. No gross focal neurologic deficits are appreciated. No gait instability. Skin:  Skin is warm, dry and intact. No rash noted. Psychiatric: Mood and affect are  normal. Speech and behavior are normal.  ____________________________________________   LABS (all labs ordered are listed, but only abnormal results are displayed)  Labs Reviewed  COMPREHENSIVE METABOLIC PANEL - Abnormal; Notable for the following components:      Result Value   Glucose, Bld 133 (*)    Total Protein 6.4 (*)    All other components within normal limits  URINALYSIS, COMPLETE (UACMP) WITH MICROSCOPIC - Abnormal; Notable for the following components:   Color, Urine COLORLESS (*)    APPearance CLEAR (*)    Specific Gravity, Urine 1.002 (*)    All other components within normal limits  CBC  LIPASE, BLOOD   ____________________________________________  EKG   ____________________________________________  RADIOLOGY  ED MD interpretation:    Official radiology report(s): No results found.  ____________________________________________   PROCEDURES  Procedure(s) performed (including Critical Care):  Procedures   ____________________________________________   INITIAL IMPRESSION / ASSESSMENT AND PLAN / ED COURSE  As part of my medical decision making, I reviewed the following data within the [place]     Patient presents with acute flareup of chronic back pain.  Patient the pain is not controlled for prescribed pain medicines.  Patient denies urinary complaints.  Patient denies radicular component to her back pain.  Discussed no acute findings on lumbar spine x-ray.  Patient physical exam consistent with muscle strain.  Patient given a prescription from patient given discharge care instruction and advised to follow-up for pain management doctor.   Betty Ferguson was evaluated in Emergency Department on 01/22/2020 for the symptoms described in the history of present illness. She was evaluated in the context of the global COVID-19 pandemic, which necessitated consideration that the patient might be at risk for infection with the SARS-CoV-2 virus  that causes COVID-19. Institutional protocols and algorithms that pertain to the evaluation of patients at risk for COVID-19 are in a state of rapid change based on information released by regulatory bodies including the CDC and federal and state organizations. These policies and algorithms were followed during the patient's care in the ED.       ____________________________________________   FINAL CLINICAL IMPRESSION(S) / ED DIAGNOSES  Final diagnoses:  Strain of lumbar region, initial encounter     ED Discharge Orders         Ordered    tiZANidine (ZANAFLEX) 4 MG tablet  Every 6 hours PRN     01/22/20 0841           Note:  This document was prepared using Dragon voice recognition software and may include unintentional dictation errors.    Sable Feil, PA-C 01/22/20 [id number]    Carrie Mew, MD 01/22/20 [phone]

## 2020-08-08 ENCOUNTER — Other Ambulatory Visit: Payer: Self-pay

## 2020-08-08 ENCOUNTER — Emergency Department: Payer: Medicare Other

## 2020-08-08 ENCOUNTER — Emergency Department
Admission: EM | Admit: 2020-08-08 | Discharge: 2020-08-09 | Disposition: A | Discharge status: Home / Self Care | Payer: Medicare Other | Attending: Emergency Medicine | Admitting: Emergency Medicine

## 2020-08-08 ENCOUNTER — Encounter: Payer: Self-pay | Admitting: Emergency Medicine

## 2020-08-08 DIAGNOSIS — M79661 Pain in right lower leg: Secondary | ICD-10-CM | POA: Diagnosis present

## 2020-08-08 DIAGNOSIS — Z87891 Personal history of nicotine dependence: Secondary | ICD-10-CM | POA: Diagnosis not present

## 2020-08-08 DIAGNOSIS — B029 Zoster without complications: Secondary | ICD-10-CM | POA: Insufficient documentation

## 2020-08-08 DIAGNOSIS — Z8505 Personal history of malignant neoplasm of liver: Secondary | ICD-10-CM | POA: Insufficient documentation

## 2020-08-08 DIAGNOSIS — Z8507 Personal history of malignant neoplasm of pancreas: Secondary | ICD-10-CM | POA: Diagnosis not present

## 2020-08-08 NOTE — ED Triage Notes (Addendum)
Patient with complaint of right calf pain with swelling that started this evening. Patient states that she went to urgent care and was sent here to be evaluated for a blood clot. Patient with positive pedal pulse.

## 2020-08-09 DIAGNOSIS — B029 Zoster without complications: Secondary | ICD-10-CM | POA: Diagnosis not present

## 2020-08-09 MED ORDER — VALACYCLOVIR HCL 1 G PO TABS: 1000.0000 mg | ORAL_TABLET | Freq: Three times a day (TID) | ORAL | 0 refills | Status: AC

## 2020-08-09 MED ORDER — VALACYCLOVIR HCL 500 MG PO TABS
1000.0000 mg | ORAL_TABLET | Freq: Once | ORAL | Status: AC
  Administered 2020-08-09: 1000 mg via ORAL
  Filled 2020-08-09: qty 2

## 2020-08-09 MED ORDER — GABAPENTIN 300 MG PO CAPS
600.0000 mg | ORAL_CAPSULE | Freq: Once | ORAL | Status: AC
  Administered 2020-08-09: 600 mg via ORAL
  Filled 2020-08-09: qty 2

## 2020-08-09 NOTE — Discharge Instructions (Addendum)
The rash located in your right thigh is concerning for shingles.  Please take Valtrex 1000 mg 3 times a day for 7 days.  Also increase her gabapentin to 600 mg 3 times a day to help with your pain.  Follow-up with your primary care doctor.  Return to the emergency room for new or worsening pain, numbness or weakness of your leg, changes in the color of your legs or toes including bluish or pale coloration.

## 2020-08-09 NOTE — ED Provider Notes (Signed)
[facility] Emergency Department Provider Note  ____________________________________________  Time seen: Approximately 1:15 AM  I have reviewed the triage vital signs and the nursing notes.   HISTORY  Chief Complaint Leg Pain   HPI Betty Ferguson is a 59 y.o. female to history of fibromyalgia, rheumatoid arthritis, lupus who presents for evaluation of right lower extremity pain.   Patient reports that she has had pain for a few days.  She describes the pain as burning located in the right thigh area and dull pain located in the right calf.  No numbness or weakness of her leg.  No changes in her chronic back pain.  No saddle anesthesia, bowel incontinence or retention.  She denies any prior history of PE or DVT, no recent travel immobilization, no chest pain or shortness of breath, no hemoptysis.  She denies any prior history of shingles.  She did have chickenpox as a kid.  The pain is moderate in intensity and worse with palpation of the skin.  Past Medical History:  Diagnosis Date   Cancer ([place])    pancreatic   Collagen vascular disease ([place])    Fibromyalgia    H/O liver cancer    Lupus ([facility])    Rheumatoid arthritis ([place])     Past Surgical History:  Procedure Laterality Date   BACK SURGERY     CHOLECYSTECTOMY     LIVER SURGERY     PANCREAS SURGERY     PANCREATECTOMY     ROTATOR CUFF REPAIR Right    TOTAL ABDOMINAL HYSTERECTOMY      Prior to Admission medications   Medication Sig Start Date End Date Taking? Authorizing Provider  azithromycin (ZITHROMAX Z-PAK) 250 MG tablet Take 2 tablets (500 mg) on  Day 1,  followed by 1 tablet (250 mg) once daily on Days 2 through 5. 12/18/19   Laban Emperor, PA-C  ondansetron (ZOFRAN ODT) 4 MG disintegrating tablet Take 1 tablet (4 mg total) by mouth every 8 (eight) hours as needed for nausea or vomiting. 12/17/19   Laban Emperor, PA-C  tiZANidine (ZANAFLEX) 4 MG tablet Take 1 tablet (4 mg total) by  mouth every 6 (six) hours as needed for muscle spasms. 01/22/20   Sable Feil, PA-C  valACYclovir (VALTREX) 1000 MG tablet Take 1 tablet (1,000 mg total) by mouth 3 (three) times daily for 7 days. 08/09/20 08/16/20  Rudene Re, MD    Allergies Toradol [ketorolac tromethamine], Doxycycline, and Morphine and related  No family history on file.  Social History Social History   Tobacco Use   Smoking status: Former Smoker   Smokeless tobacco: Never Used  Scientific laboratory technician Use: Never used  Substance Use Topics   Alcohol use: No   Drug use: No    Review of Systems  Constitutional: Negative for fever. Eyes: Negative for visual changes. ENT: Negative for sore throat. Neck: No neck pain  Cardiovascular: Negative for chest pain. Respiratory: Negative for shortness of breath. Gastrointestinal: Negative for abdominal pain, vomiting or diarrhea. Genitourinary: Negative for dysuria. Musculoskeletal: Negative for back pain. + RLE pain Skin: Negative for rash. Neurological: Negative for headaches, weakness or numbness. Psych: No SI or HI  ____________________________________________   PHYSICAL EXAM:  VITAL SIGNS: ED Triage Vitals  Enc Vitals Group     BP 08/08/20 1950 (!) 143/97     Pulse Rate 08/08/20 1950 68     Resp 08/08/20 1950 18     Temp 08/08/20 1950 97.9 F (36.6  C)     Temp Source 08/08/20 1950 Oral     SpO2 08/08/20 1950 100 %     Weight 08/08/20 1951 174 lb (78.9 kg)     Height 08/08/20 1951 5\' 2"  (1.575 m)     Head Circumference --      Peak Flow --      Pain Score 08/08/20 1951 10     Pain Loc --      Pain Edu? --      Excl. in [place]? --     Constitutional: Alert and oriented. Well appearing and in no apparent distress. HEENT:      Head: Normocephalic and atraumatic.         Eyes: Conjunctivae are normal. Sclera is non-icteric.       Mouth/Throat: Mucous membranes are moist.       Neck: Supple with no signs of meningismus. Cardiovascular:  Regular rate and rhythm. No murmurs, gallops, or rubs.  Respiratory: Normal respiratory effort. Lungs are clear to auscultation bilaterally.  Gastrointestinal: Soft, non tender, and non distended  Musculoskeletal: Patient has a rash that is tender to the touch over the R hip area, patient with burning sensation over that area, joints are all normal with no swelling and full painless ROM, no erythema, no warmth, no swelling, strong DP, PT, and femoral pulses, normal strength, sensation, and reflexes Neurologic: Normal speech and language. Face is symmetric. Moving all extremities. No gross focal neurologic deficits are appreciated. Skin: Skin is warm, dry and intact. No rash noted. Psychiatric: Mood and affect are normal. Speech and behavior are normal.  ____________________________________________   LABS (all labs ordered are listed, but only abnormal results are displayed)  Labs Reviewed - No data to display ____________________________________________  EKG  none  ____________________________________________  RADIOLOGY  I have personally reviewed the images performed during this visit and I agree with the Radiologist's read.   Interpretation by Radiologist:  US Venous Img Lower Unilateral Right  Result Date: 08/08/2020 CLINICAL DATA:  Leg pain, swelling EXAM: RIGHT LOWER EXTREMITY VENOUS DOPPLER ULTRASOUND TECHNIQUE: Gray-scale sonography with compression, as well as color and duplex ultrasound, were performed to evaluate the deep venous system(s) from the level of the common femoral vein through the popliteal and proximal calf veins. COMPARISON:  None. FINDINGS: VENOUS Normal compressibility of the common femoral, superficial femoral, and popliteal veins, as well as the visualized calf veins. Visualized portions of profunda femoral vein and great saphenous vein unremarkable. No filling defects to suggest DVT on grayscale or color Doppler imaging. Doppler waveforms show normal direction  of venous flow, normal respiratory plasticity and response to augmentation. Limited views of the contralateral common femoral vein are unremarkable. OTHER None. Limitations: none IMPRESSION: Negative. Electronically Signed   By: Rolm Baptise M.D.   On: 08/08/2020 20:48     ____________________________________________   PROCEDURES  Procedure(s) performed: None Procedures Critical Care performed:  None ____________________________________________   INITIAL IMPRESSION / ASSESSMENT AND PLAN / ED COURSE  59 y.o. female to history of fibromyalgia, rheumatoid arthritis, lupus who presents for evaluation of right lower extremity pain.  On exam right lower extremity is completely neurovascularly intact with strong pulses, normal strength and sensation, normal reflexes, there is no asymmetric swelling, there is no signs of cellulitis, there is no signs of ischemia.  Joints have no swelling and have full painless range of motion.  She does have a rash consistent with shingles located on the right anterior thigh which is the source of  her burning pain.  Patient will be started on valacyclovir and gabapentin.  She takes 600 mg of gabapentin twice daily will increase to 3 times daily.  Discussed contact precautions with immune suppressed and pregnant people.  Discussed close follow-up with PCP and my standard return precautions.  Old medical records reviewed.  Ultrasound visualized by me with no signs of DVT.      _____________________________________________ Please note:  Patient was evaluated in Emergency Department today for the symptoms described in the history of present illness. Patient was evaluated in the context of the global COVID-19 pandemic, which necessitated consideration that the patient might be at risk for infection with the SARS-CoV-2 virus that causes COVID-19. Institutional protocols and algorithms that pertain to the evaluation of patients at risk for COVID-19 are in a state of rapid  change based on information released by regulatory bodies including the CDC and federal and state organizations. These policies and algorithms were followed during the patient's care in the ED.  Some ED evaluations and interventions may be delayed as a result of limited staffing during the pandemic.   [place] Controlled Substance Database was reviewed by me. ____________________________________________   FINAL CLINICAL IMPRESSION(S) / ED DIAGNOSES   Final diagnoses:  Herpes zoster without complication      NEW MEDICATIONS STARTED DURING THIS VISIT:  ED Discharge Orders         Ordered    valACYclovir (VALTREX) 1000 MG tablet  3 times daily        08/09/20 0123           Note:  This document was prepared using Dragon voice recognition software and may include unintentional dictation errors.    Alfred Levins, [place], MD 08/09/20 [phone]

## 2021-01-15 ENCOUNTER — Other Ambulatory Visit: Payer: Self-pay

## 2021-01-15 ENCOUNTER — Emergency Department
Admission: EM | Admit: 2021-01-15 | Discharge: 2021-01-15 | Disposition: A | Discharge status: Home / Self Care | Payer: Medicare Other | Attending: Emergency Medicine | Admitting: Emergency Medicine

## 2021-01-15 ENCOUNTER — Emergency Department: Payer: Medicare Other

## 2021-01-15 DIAGNOSIS — Z87891 Personal history of nicotine dependence: Secondary | ICD-10-CM | POA: Insufficient documentation

## 2021-01-15 DIAGNOSIS — M25552 Pain in left hip: Secondary | ICD-10-CM

## 2021-01-15 DIAGNOSIS — S76012A Strain of muscle, fascia and tendon of left hip, initial encounter: Secondary | ICD-10-CM | POA: Insufficient documentation

## 2021-01-15 DIAGNOSIS — S79912A Unspecified injury of left hip, initial encounter: Secondary | ICD-10-CM | POA: Diagnosis present

## 2021-01-15 DIAGNOSIS — Z8507 Personal history of malignant neoplasm of pancreas: Secondary | ICD-10-CM | POA: Diagnosis not present

## 2021-01-15 DIAGNOSIS — Z8505 Personal history of malignant neoplasm of liver: Secondary | ICD-10-CM | POA: Insufficient documentation

## 2021-01-15 DIAGNOSIS — W228XXA Striking against or struck by other objects, initial encounter: Secondary | ICD-10-CM | POA: Insufficient documentation

## 2021-01-15 MED ORDER — LIDOCAINE 5 % EX PTCH: 1.0000 | MEDICATED_PATCH | Freq: Two times a day (BID) | CUTANEOUS | 0 refills | Status: AC | PRN

## 2021-01-15 MED ORDER — TIZANIDINE HCL 2 MG PO TABS: 2.0000 mg | ORAL_TABLET | Freq: Three times a day (TID) | ORAL | 0 refills | Status: AC

## 2021-01-15 MED ORDER — LIDOCAINE 5 % EX PTCH
1.0000 | MEDICATED_PATCH | CUTANEOUS | Status: DC
  Administered 2021-01-15: 1 via TRANSDERMAL
  Filled 2021-01-15: qty 1

## 2021-01-15 MED ORDER — ORPHENADRINE CITRATE 30 MG/ML IJ SOLN
60.0000 mg | INTRAMUSCULAR | Status: AC
  Administered 2021-01-15: 60 mg via INTRAMUSCULAR
  Filled 2021-01-15: qty 2

## 2021-01-15 NOTE — Discharge Instructions (Addendum)
Your exam and x-ray are negative for any acute fracture or dislocation.  Symptoms likely represent a muscle strain.  Take the prescription meds as directed.  Follow-up with your primary provider for ongoing symptomatic care.  Return to ED if needed.

## 2021-01-15 NOTE — ED Triage Notes (Signed)
Pt to ER via POV with complaints of left hip pain. Pt reports standing on an escalator, her right leg was going forward but her left leg got stuck behind and the escalator jerked her leg. Reports pain at sight and difficulty ambulating.

## 2021-01-17 NOTE — ED Provider Notes (Signed)
[facility] Emergency Department Provider Note ____________________________________________  Time seen: 1919  I have reviewed the triage vital signs and the nursing notes.  HISTORY  Chief Complaint  Hip Pain  HPI Betty Ferguson is a 60 y.o. female with a history of fibromyalgia, lupus, rheumatoid arthritis, and collagen vascular disease, presents with acute left hip pain.  Patient reports injury occurred 3 days prior when she was out of town and try to get on an escalator.  She describes her right leg was full of her left leg except behind her, causing a jerking injury to the left leg.  She denies any outright fall or head injury.  She presents now using a single-point cane to ambulate, reporting pain to the lateral and posterior left hip.  She denies any bladder or bowel incontinence, foot drop, or saddle anesthesia.   Past Medical History:  Diagnosis Date  . Cancer [facility])    pancreatic  . Collagen vascular disease ([place])   . Fibromyalgia   . H/O liver cancer   . Lupus ([place])   . Rheumatoid arthritis ([place])     There are no problems to display for this patient.   Past Surgical History:  Procedure Laterality Date  . BACK SURGERY    . CHOLECYSTECTOMY    . LIVER SURGERY    . PANCREAS SURGERY    . PANCREATECTOMY    . ROTATOR CUFF REPAIR Right   . TOTAL ABDOMINAL HYSTERECTOMY      Prior to Admission medications   Medication Sig Start Date End Date Taking? Authorizing Provider  lidocaine (LIDODERM) 5 % Place 1 patch onto the skin every 12 (twelve) hours as needed for up to 10 days. Remove & Discard patch after 12 hours of wear each day. 01/15/21 01/25/21 Yes Lariya Kinzie, Dannielle Karvonen, PA-C  tiZANidine (ZANAFLEX) 2 MG tablet Take 1 tablet (2 mg total) by mouth 3 (three) times daily for 7 days. 01/15/21 01/22/21 Yes Kymoni Monday, Dannielle Karvonen, PA-C    Allergies Toradol [ketorolac tromethamine], Doxycycline, and Morphine and related  History reviewed. No pertinent  family history.  Social History Social History   Tobacco Use  . Smoking status: Former Research scientist (life sciences)  . Smokeless tobacco: Never Used  Vaping Use  . Vaping Use: Never used  Substance Use Topics  . Alcohol use: No  . Drug use: No    Review of Systems  Constitutional: Negative for fever. Cardiovascular: Negative for chest pain. Respiratory: Negative for shortness of breath. Gastrointestinal: Negative for abdominal pain, vomiting and diarrhea. Genitourinary: Negative for dysuria. Musculoskeletal: Negative for back pain. Reports left hip pain as above Skin: Negative for rash. Neurological: Negative for headaches, focal weakness or numbness. ____________________________________________  PHYSICAL EXAM:  VITAL SIGNS: ED Triage Vitals  Enc Vitals Group     BP 01/15/21 1836 (!) 180/76     Pulse Rate 01/15/21 1836 74     Resp 01/15/21 1836 16     Temp 01/15/21 1836 98.5 F (36.9 C)     Temp Source 01/15/21 1836 Oral     SpO2 01/15/21 1836 100 %     Weight 01/15/21 1837 170 lb (77.1 kg)     Height 01/15/21 1837 5\' 2"  (1.575 m)     Head Circumference --      Peak Flow --      Pain Score 01/15/21 1836 10     Pain Loc --      Pain Edu? --      Excl. in [place]? --  Constitutional: Alert and oriented. Well appearing and in no distress. Head: Normocephalic and atraumatic. Neck: Supple.  Range of motion. Cardiovascular: Normal rate, regular rhythm. Normal distal pulses. Respiratory: Normal respiratory effort. No wheezes/rales/rhonchi. Gastrointestinal: Soft and nontender. No distention. Musculoskeletal: Spinal alignment without midline tenderness, spasm, vomiting, or step-off.  Patient mildly tender to palpation over the left gluteal musculature.  No significant trochanteric hip pain and no groin pain elicited on the left.  She is able demonstrate normal hip flexion extension range on exam.  Normal internal and external rotation is noted.  Nontender with normal range of motion in all  extremities.  Neurologic: Cranial nerves II to XII grossly intact.  Normal LE DTRs bilaterally.  Normal gait without ataxia. Normal speech and language. No gross focal neurologic deficits are appreciated. Skin:  Skin is warm, dry and intact. No rash noted. Psychiatric: Mood and affect are normal. Patient exhibits appropriate insight and judgment. ___________________________________________   RADIOLOGY  DG Left Hip w/ Pelvis  IMPRESSION: No displaced fracture or dislocation of the left hip. The joint spaces are well preserved.  I, Melvenia Needles, personally viewed and evaluated these images (plain radiographs) as part of my medical decision making, as well as reviewing the written report by the radiologist. ____________________________________________  PROCEDURES  Lidocaine 5% transdermal  Norflex 60 mg IM  Procedures ____________________________________________  INITIAL IMPRESSION / ASSESSMENT AND PLAN / ED COURSE  DDX: hip strain, ITB syndrome, bursitis  Patient with ED evaluation of acute left hip pain following mechanical injury.  Exam was reassuring as it showed no signs of acute neuromuscular deficit.  No x-ray evidence of acute fracture or dislocation.  Patient likely experiencing some hip strain secondary to twisting injury.  She will be placed on Lidoderm patches and tizanidine.  She will follow with primary provider for ongoing symptoms.  Return precautions have been discussed.  Betty Ferguson was evaluated in Emergency Department on 01/17/2021 for the symptoms described in the history of present illness. She was evaluated in the context of the global COVID-19 pandemic, which necessitated consideration that the patient might be at risk for infection with the SARS-CoV-2 virus that causes COVID-19. Institutional protocols and algorithms that pertain to the evaluation of patients at risk for COVID-19 are in a state of rapid change based on information released by  regulatory bodies including the CDC and federal and state organizations. These policies and algorithms were followed during the patient's care in the ED. ____________________________________________  FINAL CLINICAL IMPRESSION(S) / ED DIAGNOSES  Final diagnoses:  Left hip pain  Hip strain, left, initial encounter      Melvenia Needles, PA-C 01/17/21 2116    Carrie Mew, MD 01/24/21 [phone]

## 2021-03-29 ENCOUNTER — Other Ambulatory Visit: Payer: Self-pay

## 2021-03-29 ENCOUNTER — Emergency Department: Payer: Medicare Other

## 2021-03-29 ENCOUNTER — Observation Stay
Admission: EM | Admit: 2021-03-29 | Discharge: 2021-03-30 | Disposition: A | Discharge status: Home / Self Care | Payer: Medicare Other | Attending: Internal Medicine | Admitting: Internal Medicine

## 2021-03-29 ENCOUNTER — Encounter: Payer: Self-pay | Admitting: Emergency Medicine

## 2021-03-29 DIAGNOSIS — G459 Transient cerebral ischemic attack, unspecified: Principal | ICD-10-CM | POA: Diagnosis present

## 2021-03-29 DIAGNOSIS — E119 Type 2 diabetes mellitus without complications: Secondary | ICD-10-CM | POA: Insufficient documentation

## 2021-03-29 DIAGNOSIS — Z79899 Other long term (current) drug therapy: Secondary | ICD-10-CM | POA: Diagnosis not present

## 2021-03-29 DIAGNOSIS — Z87891 Personal history of nicotine dependence: Secondary | ICD-10-CM | POA: Insufficient documentation

## 2021-03-29 DIAGNOSIS — Z7984 Long term (current) use of oral hypoglycemic drugs: Secondary | ICD-10-CM | POA: Diagnosis not present

## 2021-03-29 DIAGNOSIS — R519 Headache, unspecified: Secondary | ICD-10-CM | POA: Diagnosis not present

## 2021-03-29 DIAGNOSIS — E1142 Type 2 diabetes mellitus with diabetic polyneuropathy: Secondary | ICD-10-CM

## 2021-03-29 DIAGNOSIS — Z20822 Contact with and (suspected) exposure to covid-19: Secondary | ICD-10-CM | POA: Insufficient documentation

## 2021-03-29 DIAGNOSIS — Z8507 Personal history of malignant neoplasm of pancreas: Secondary | ICD-10-CM | POA: Insufficient documentation

## 2021-03-29 DIAGNOSIS — R531 Weakness: Secondary | ICD-10-CM | POA: Diagnosis present

## 2021-03-29 DIAGNOSIS — R0602 Shortness of breath: Secondary | ICD-10-CM | POA: Diagnosis not present

## 2021-03-29 DIAGNOSIS — Z7982 Long term (current) use of aspirin: Secondary | ICD-10-CM | POA: Diagnosis not present

## 2021-03-29 DIAGNOSIS — Z8505 Personal history of malignant neoplasm of liver: Secondary | ICD-10-CM | POA: Diagnosis not present

## 2021-03-29 DIAGNOSIS — R609 Edema, unspecified: Secondary | ICD-10-CM

## 2021-03-29 DIAGNOSIS — M25511 Pain in right shoulder: Secondary | ICD-10-CM | POA: Diagnosis not present

## 2021-03-29 DIAGNOSIS — E785 Hyperlipidemia, unspecified: Secondary | ICD-10-CM | POA: Diagnosis not present

## 2021-03-29 LAB — PROTIME-INR
INR: 1 (ref 0.8–1.2)
Prothrombin Time: 12.8 seconds (ref 11.4–15.2)

## 2021-03-29 LAB — DIFFERENTIAL
Abs Immature Granulocytes: 0.04 10*3/uL (ref 0.00–0.07)
Basophils Absolute: 0 10*3/uL (ref 0.0–0.1)
Basophils Relative: 1 %
Eosinophils Absolute: 0.1 10*3/uL (ref 0.0–0.5)
Eosinophils Relative: 1 %
Immature Granulocytes: 1 %
Lymphocytes Relative: 34 %
Lymphs Abs: 2.1 10*3/uL (ref 0.7–4.0)
Monocytes Absolute: 0.6 10*3/uL (ref 0.1–1.0)
Monocytes Relative: 10 %
Neutro Abs: 3.4 10*3/uL (ref 1.7–7.7)
Neutrophils Relative %: 53 %

## 2021-03-29 LAB — COMPREHENSIVE METABOLIC PANEL
ALT: 12 U/L (ref 0–44)
AST: 20 U/L (ref 15–41)
Albumin: 3.5 g/dL (ref 3.5–5.0)
Alkaline Phosphatase: 42 U/L (ref 38–126)
Anion gap: 7 (ref 5–15)
BUN: 20 mg/dL (ref 6–20)
CO2: 23 mmol/L (ref 22–32)
Calcium: 8.6 mg/dL — ABNORMAL LOW (ref 8.9–10.3)
Chloride: 106 mmol/L (ref 98–111)
Creatinine, Ser: 0.93 mg/dL (ref 0.44–1.00)
GFR, Estimated: >60 mL/min (ref >60)
Glucose, Bld: 96 mg/dL (ref 70–99)
Potassium: 4.6 mmol/L (ref 3.5–5.1)
Sodium: 136 mmol/L (ref 135–145)
Total Bilirubin: 0.5 mg/dL (ref 0.3–1.2)
Total Protein: 6.4 g/dL — ABNORMAL LOW (ref 6.5–8.1)

## 2021-03-29 LAB — CBC
HCT: 43.3 % (ref 36.0–46.0)
Hemoglobin: 14 g/dL (ref 12.0–15.0)
MCH: 30.9 pg (ref 26.0–34.0)
MCHC: 32.3 g/dL (ref 30.0–36.0)
MCV: 95.6 fL (ref 80.0–100.0)
Platelets: 164 10*3/uL (ref 150–400)
RBC: 4.53 MIL/uL (ref 3.87–5.11)
RDW: 15.2 % (ref 11.5–15.5)
WBC: 6.3 10*3/uL (ref 4.0–10.5)
nRBC: 0 % (ref 0.0–0.2)

## 2021-03-29 LAB — APTT: aPTT: 27 seconds (ref 24–36)

## 2021-03-29 LAB — CBG MONITORING, ED: Glucose-Capillary: 96 mg/dL (ref 70–99)

## 2021-03-29 LAB — TROPONIN I (HIGH SENSITIVITY): Troponin I (High Sensitivity): 4 ng/L (ref <18)

## 2021-03-29 MED ORDER — IOHEXOL 350 MG/ML SOLN
125.0000 mL | Freq: Once | INTRAVENOUS | Status: AC | PRN
  Administered 2021-03-29: 125 mL via INTRAVENOUS

## 2021-03-29 MED ORDER — PROCHLORPERAZINE EDISYLATE 10 MG/2ML IJ SOLN
10.0000 mg | Freq: Once | INTRAMUSCULAR | Status: AC
  Administered 2021-03-29: 10 mg via INTRAVENOUS
  Filled 2021-03-29: qty 2

## 2021-03-29 MED ORDER — ACETAMINOPHEN 325 MG PO TABS
650.0000 mg | ORAL_TABLET | Freq: Once | ORAL | Status: AC
  Administered 2021-03-29: 650 mg via ORAL
  Filled 2021-03-29: qty 2

## 2021-03-29 MED ORDER — SODIUM CHLORIDE 0.9% FLUSH
3.0000 mL | Freq: Once | INTRAVENOUS | Status: DC
Start: 2021-03-29 — End: 2021-03-30

## 2021-03-29 MED ORDER — DIPHENHYDRAMINE HCL 50 MG/ML IJ SOLN
12.5000 mg | Freq: Once | INTRAMUSCULAR | Status: AC
  Administered 2021-03-29: 12.5 mg via INTRAVENOUS
  Filled 2021-03-29: qty 1

## 2021-03-29 NOTE — Progress Notes (Signed)
Responded to code stroke, upon arrival doctor was in with the patient. I checked at entrance for family. Asked to be called again if support is needed. Offered silent prayer in the room.

## 2021-03-29 NOTE — ED Notes (Addendum)
Pt arrives from CT to room- Dr. Quentin Cornwall @ the bedside. Pt attached to the monitor and VS obtained. Pt endorses numbness to the right side of her body starting between 2000-2030 tonight with associated SOB & CP; right sided neck/shoulder pain, and a headache.  [id number]

## 2021-03-29 NOTE — ED Provider Notes (Signed)
[facility] Emergency Department Provider Note    Event Date/Time   First MD Initiated Contact with Patient 03/29/21 2146     (approximate)  I have reviewed the triage vital signs and the nursing notes.   HISTORY  Chief Complaint Numbness    HPI Betty Ferguson is a 60 y.o. female below listed past medical history presents to the ER for evaluation of sudden onset right shoulder pain back of her neck going down her right arm associated with subjective tingling and weakness.  Does have mild headache.  Happened around 2 hours ago.  Denies any associated blurred vision.  No trouble finding words no slurred speech.  No facial droop.  Never had pain like this before denies any trauma does have a history of rotator cuff injury.  Does have some associated shortness of breath no diaphoresis.  No fevers.  Past Medical History:  Diagnosis Date   Cancer [facility])    pancreatic   Collagen vascular disease ([place])    Fibromyalgia    H/O liver cancer    Lupus ([place])    Rheumatoid arthritis ([place])    History reviewed. No pertinent family history. Past Surgical History:  Procedure Laterality Date   BACK SURGERY     CHOLECYSTECTOMY     LIVER SURGERY     PANCREAS SURGERY     PANCREATECTOMY     ROTATOR CUFF REPAIR Right    TOTAL ABDOMINAL HYSTERECTOMY     There are no problems to display for this patient.     Prior to Admission medications   Not on File    Allergies Toradol [ketorolac tromethamine], Doxycycline, and Morphine and related    Social History Social History   Tobacco Use   Smoking status: Former    Pack years: 0.00   Smokeless tobacco: Never  Vaping Use   Vaping Use: Never used  Substance Use Topics   Alcohol use: No   Drug use: No    Review of Systems Patient denies headaches, rhinorrhea, blurry vision, numbness, shortness of breath, chest pain, edema, cough, abdominal pain, nausea, vomiting, diarrhea, dysuria, fevers, rashes or  hallucinations unless otherwise stated above in HPI. ____________________________________________   PHYSICAL EXAM:  VITAL SIGNS: Vitals:   03/29/21 2157 03/29/21 2238  BP: (!) 181/72 (!) 149/66  Pulse: 63 60  Resp: 18 15  Temp:    SpO2: 100% 100%    Constitutional: Alert and oriented.  Eyes: Conjunctivae are normal.  Head: Atraumatic. Nose: No congestion/rhinnorhea. Mouth/Throat: Mucous membranes are moist.   Neck: No stridor. Painless ROM.  Cardiovascular: Normal rate, regular rhythm. Grossly normal heart sounds.  Good peripheral circulation. Respiratory: Normal respiratory effort.  No retractions. Lungs CTAB. Gastrointestinal: Soft and nontender. No distention. No abdominal bruits. No CVA tenderness. Genitourinary:  Musculoskeletal: pain reproduced with palpation of right posterior shoulder, no deformity. No lower extremity tenderness nor edema.  No joint effusions. Neurologic: CN- intact.  No facial droop, Normal FNF.  Normal heel to shin.  Sensation intact bilaterally. Normal speech and language. No gross focal neurologic deficits are appreciated.  Skin:  Skin is warm, dry and intact. No rash noted. Psychiatric: Mood and affect are normal. Speech and behavior are normal.  ____________________________________________   LABS (all labs ordered are listed, but only abnormal results are displayed)  Results for orders placed or performed during the hospital encounter of 03/29/21 (from the past 24 hour(s))  CBG monitoring, ED     Status: None   Collection Time: 03/29/21  9:35 PM  Result Value Ref Range   Glucose-Capillary 96 70 - 99 mg/dL  Protime-INR     Status: None   Collection Time: 03/29/21 10:00 PM  Result Value Ref Range   Prothrombin Time 12.8 11.4 - 15.2 seconds   INR 1.0 0.8 - 1.2  APTT     Status: None   Collection Time: 03/29/21 10:00 PM  Result Value Ref Range   aPTT 27 24 - 36 seconds  CBC     Status: None   Collection Time: 03/29/21 10:00 PM  Result  Value Ref Range   WBC 6.3 4.0 - 10.5 K/uL   RBC 4.53 3.87 - 5.11 MIL/uL   Hemoglobin 14.0 12.0 - 15.0 g/dL   HCT 43.3 36.0 - 46.0 %   MCV 95.6 80.0 - 100.0 fL   MCH 30.9 26.0 - 34.0 pg   MCHC 32.3 30.0 - 36.0 g/dL   RDW 15.2 11.5 - 15.5 %   Platelets 164 150 - 400 K/uL   nRBC 0.0 0.0 - 0.2 %  Differential     Status: None   Collection Time: 03/29/21 10:00 PM  Result Value Ref Range   Neutrophils Relative % 53 %   Neutro Abs 3.4 1.7 - 7.7 K/uL   Lymphocytes Relative 34 %   Lymphs Abs 2.1 0.7 - 4.0 K/uL   Monocytes Relative 10 %   Monocytes Absolute 0.6 0.1 - 1.0 K/uL   Eosinophils Relative 1 %   Eosinophils Absolute 0.1 0.0 - 0.5 K/uL   Basophils Relative 1 %   Basophils Absolute 0.0 0.0 - 0.1 K/uL   Immature Granulocytes 1 %   Abs Immature Granulocytes 0.04 0.00 - 0.07 K/uL  Comprehensive metabolic panel     Status: Abnormal   Collection Time: 03/29/21 10:00 PM  Result Value Ref Range   Sodium 136 135 - 145 mmol/L   Potassium 4.6 3.5 - 5.1 mmol/L   Chloride 106 98 - 111 mmol/L   CO2 23 22 - 32 mmol/L   Glucose, Bld 96 70 - 99 mg/dL   BUN 20 6 - 20 mg/dL   Creatinine, Ser 0.93 0.44 - 1.00 mg/dL   Calcium 8.6 (L) 8.9 - 10.3 mg/dL   Total Protein 6.4 (L) 6.5 - 8.1 g/dL   Albumin 3.5 3.5 - 5.0 g/dL   AST 20 15 - 41 U/L   ALT 12 0 - 44 U/L   Alkaline Phosphatase 42 38 - 126 U/L   Total Bilirubin 0.5 0.3 - 1.2 mg/dL   GFR, Estimated >60 >60 mL/min   Anion gap 7 5 - 15   ____________________________________________  EKG My review and personal interpretation at Time: 21:31   Indication: shoulder pain  Rate: 55  Rhythm: sinus Axis: normal Other: normal intervals, no stemi, nonspecific st abn ____________________________________________  RADIOLOGY  I personally reviewed all radiographic images ordered to evaluate for the above acute complaints and reviewed radiology reports and findings.  These findings were personally discussed with the patient.  Please see medical  record for radiology report.  ____________________________________________   PROCEDURES  Procedure(s) performed:  Procedures    Critical Care performed: no ____________________________________________   INITIAL IMPRESSION / ASSESSMENT AND PLAN / ED COURSE  Pertinent labs & imaging results that were available during my care of the patient were reviewed by me and considered in my medical decision making (see chart for details).   DDX: Shoulder injury, musculoskeletal strain, ACS, PE, mass, pneumothorax, dissection, CVA, TIA  Jeri Cos  is a 60 y.o. who presents to the ED with presentation as described above.  Patient brought back immediately upon arrival given her presentation.  On exam and further questioning I do not believe this is suggestive of acute stroke given her pain and location of discomfort without any objective neurodeficits.  Given her hypertension I am concerned for possible dissection.  Will evaluate for ACS.  Blood work sent for by differential.  EKG nonischemic.  Clinical Course as of 03/29/21 2336  Nancy Fetter Mar 29, 2021  2333 CTA without acute abnormality.  Still awaiting CT angio head and neck.  Still waiting troponin.  Patient be signed out to oncoming physician pending serial enzymes and reassessment. [PR]    Clinical Course User Index [PR] Merlyn Lot, MD    The patient was evaluated in Emergency Department today for the symptoms described in the history of present illness. He/she was evaluated in the context of the global COVID-19 pandemic, which necessitated consideration that the patient might be at risk for infection with the SARS-CoV-2 virus that causes COVID-19. Institutional protocols and algorithms that pertain to the evaluation of patients at risk for COVID-19 are in a state of rapid change based on information released by regulatory bodies including the CDC and federal and state organizations. These policies and algorithms were followed during the  patient's care in the ED.  As part of my medical decision making, I reviewed the following data within the [place] notes reviewed and incorporated, Labs reviewed, notes from prior ED visits and [place] Controlled Substance Database   ____________________________________________   FINAL CLINICAL IMPRESSION(S) / ED DIAGNOSES  Final diagnoses:  Acute pain of right shoulder  Acute nonintractable headache, unspecified headache type      NEW MEDICATIONS STARTED DURING THIS VISIT:  New Prescriptions   No medications on file     Note:  This document was prepared using Dragon voice recognition software and may include unintentional dictation errors.    Merlyn Lot, MD 03/29/21 [phone]

## 2021-03-29 NOTE — ED Notes (Signed)
Activated Code Stroke 

## 2021-03-29 NOTE — ED Triage Notes (Signed)
Pt to ED via POV, states L arm went numb and had SHOB. Pt states symptoms are resolving. Pt with continued L facial numbness at this time, facial symmetry intact, grip strength equal at this time, no drift noted to arms, no neglect noted. Pt states TYY 3496, noticed symptoms at approx 2045. Pt states chewed 4 baby aspirins PTA. Pt also c/o HA at this time.

## 2021-03-30 ENCOUNTER — Encounter: Payer: Self-pay | Admitting: Family Medicine

## 2021-03-30 ENCOUNTER — Observation Stay: Payer: Medicare Other

## 2021-03-30 DIAGNOSIS — G459 Transient cerebral ischemic attack, unspecified: Secondary | ICD-10-CM

## 2021-03-30 DIAGNOSIS — E785 Hyperlipidemia, unspecified: Secondary | ICD-10-CM | POA: Diagnosis not present

## 2021-03-30 LAB — CBC
HCT: 37.6 % (ref 36.0–46.0)
Hemoglobin: 12.4 g/dL (ref 12.0–15.0)
MCH: 30.7 pg (ref 26.0–34.0)
MCHC: 33 g/dL (ref 30.0–36.0)
MCV: 93.1 fL (ref 80.0–100.0)
Platelets: 150 10*3/uL (ref 150–400)
RBC: 4.04 MIL/uL (ref 3.87–5.11)
RDW: 14.9 % (ref 11.5–15.5)
WBC: 5.4 10*3/uL (ref 4.0–10.5)
nRBC: 0 % (ref 0.0–0.2)

## 2021-03-30 LAB — BASIC METABOLIC PANEL
Anion gap: 5 (ref 5–15)
BUN: 18 mg/dL (ref 6–20)
CO2: 23 mmol/L (ref 22–32)
Calcium: 8.2 mg/dL — ABNORMAL LOW (ref 8.9–10.3)
Chloride: 108 mmol/L (ref 98–111)
Creatinine, Ser: 0.74 mg/dL (ref 0.44–1.00)
GFR, Estimated: >60 mL/min (ref >60)
Glucose, Bld: 114 mg/dL — ABNORMAL HIGH (ref 70–99)
Potassium: 3.7 mmol/L (ref 3.5–5.1)
Sodium: 136 mmol/L (ref 135–145)

## 2021-03-30 LAB — LIPID PANEL
Cholesterol: 172 mg/dL (ref 0–200)
HDL: 57 mg/dL (ref >40)
LDL Cholesterol: 103 mg/dL — ABNORMAL HIGH (ref 0–99)
Total CHOL/HDL Ratio: 3 RATIO
Triglycerides: 62 mg/dL (ref <150)
VLDL: 12 mg/dL (ref 0–40)

## 2021-03-30 LAB — SARS CORONAVIRUS 2 (TAT 6-24 HRS): SARS Coronavirus 2: NEGATIVE

## 2021-03-30 LAB — HIV ANTIBODY (ROUTINE TESTING W REFLEX): HIV Screen 4th Generation wRfx: NONREACTIVE

## 2021-03-30 LAB — TROPONIN I (HIGH SENSITIVITY): Troponin I (High Sensitivity): 4 ng/L (ref <18)

## 2021-03-30 MED ORDER — MAGNESIUM HYDROXIDE 400 MG/5ML PO SUSP
30.0000 mL | Freq: Every day | ORAL | Status: DC | PRN
  Filled 2021-03-30: qty 30

## 2021-03-30 MED ORDER — ONDANSETRON HCL 4 MG/2ML IJ SOLN: 4.0000 mg | Freq: Four times a day (QID) | INTRAMUSCULAR | Status: DC | PRN

## 2021-03-30 MED ORDER — CLOPIDOGREL BISULFATE 75 MG PO TABS
75.0000 mg | ORAL_TABLET | Freq: Every day | ORAL | Status: DC
  Administered 2021-03-30 (×2): 75 mg via ORAL
  Filled 2021-03-30 (×2): qty 1

## 2021-03-30 MED ORDER — HYDROMORPHONE HCL 1 MG/ML IJ SOLN
1.0000 mg | INTRAMUSCULAR | Status: DC | PRN
Start: 2021-03-30 — End: 2021-03-30
  Administered 2021-03-30 (×3): 1 mg via INTRAVENOUS
  Filled 2021-03-30 (×3): qty 1

## 2021-03-30 MED ORDER — ENOXAPARIN SODIUM 40 MG/0.4ML IJ SOSY
0.5000 mg/kg | PREFILLED_SYRINGE | INTRAMUSCULAR | Status: DC
  Administered 2021-03-30: 37.5 mg via SUBCUTANEOUS
  Filled 2021-03-30: qty 0.4

## 2021-03-30 MED ORDER — ONDANSETRON HCL 4 MG PO TABS: 4.0000 mg | ORAL_TABLET | Freq: Four times a day (QID) | ORAL | Status: DC | PRN

## 2021-03-30 MED ORDER — TRAZODONE HCL 50 MG PO TABS: 25.0000 mg | ORAL_TABLET | Freq: Every evening | ORAL | Status: DC | PRN

## 2021-03-30 MED ORDER — ACETAMINOPHEN 650 MG RE SUPP: 650.0000 mg | Freq: Four times a day (QID) | RECTAL | Status: DC | PRN

## 2021-03-30 MED ORDER — SODIUM CHLORIDE 0.9 % IV SOLN: INTRAVENOUS | Status: DC

## 2021-03-30 MED ORDER — LORAZEPAM 2 MG/ML IJ SOLN
0.5000 mg | Freq: Once | INTRAMUSCULAR | Status: AC
  Administered 2021-03-30: 0.5 mg via INTRAVENOUS
  Filled 2021-03-30: qty 1

## 2021-03-30 MED ORDER — ASPIRIN 81 MG PO CHEW
324.0000 mg | CHEWABLE_TABLET | Freq: Once | ORAL | Status: AC
  Administered 2021-03-30: 324 mg via ORAL
  Filled 2021-03-30: qty 4

## 2021-03-30 MED ORDER — ACETAMINOPHEN 325 MG PO TABS: 650.0000 mg | ORAL_TABLET | Freq: Four times a day (QID) | ORAL | Status: DC | PRN

## 2021-03-30 MED ORDER — STROKE: EARLY STAGES OF RECOVERY BOOK
Freq: Once | Status: AC
Start: 2021-03-30 — End: 2021-03-30
  Filled 2021-03-30: qty 1

## 2021-03-30 NOTE — Discharge Summary (Signed)
Betty Ferguson BOF:[id number] DOB: 09-24-1961 DOA: 03/29/2021  PCP: Betty Roys, MD  Admit date: 03/29/2021 Discharge date: 03/30/2021  Admitted From: home Disposition:  home  Recommendations for Outpatient Follow-up:  Follow up with PCP in 1 week Please obtain BMP/CBC in one week Please follow up with neurology in one week      Discharge Condition:Stable CODE STATUS:full  Diet recommendation: Heart Healthy  Brief/Interim Summary: Betty Ferguson is a 60 y.o. African-American female with medical history significant for fibromyalgia, lupus, migraine headache, rheumatoid arthritis and history of pancreatic and liver cancer status postsurgical resection, who presented to the emergency room, who presented to the emergency room with acute onset of headache with associated right upper extremity numbness and weakness as well as right shoulder pain.  She admitted to associated dizziness with her headache.  She denied any lower extremity weakness or numbness.  She denied dysphagia or dysarthria.  No tinnitus or vertigo.  No urinary or stool incontinence.  No dyspnea or cough or chest pain or palpitations.  She stated that she has not had a migraine in years and it was different from this headache. She also c/o LLE swelling.   Venous US of left lower extremity was negative for DVT. Neurology was consulted.  Patient underwent MRI of the brain that was negative.  Neurology recommended continuing aspirin.  They also recommended if patient does not need estrogen replacement therapy it safe for her to discontinue this medication as it increases the risk of stroke.  And to follow-up with outpatient neurology.  She is going to follow-up with her primary care for her headaches.  Patient is stable to be discharged home.  Head and neck CTA revealed: 1. Negative CTA for emergent large vessel occlusion. 2. Atheromatous change about the origin of the left vertebral artery with associated moderate to severe  stenosis. 3. Additional mild for age atheromatous change elsewhere about the major arterial vasculature of the head and neck as above. No other hemodynamically significant or correctable stenosis. 4. Fetal type origin of the PCAs with associated diminutive vertebrobasilar system. 5. Aortic Atherosclerosis and emphysema.  Chest CTA revealed no evidence for aortic dissection or aneurysm.  It showed scattered coronary artery calcifications aortic atherosclerosis and emphysema.    Discharge Diagnoses:  Active Problems:   TIA (transient ischemic attack)    Discharge Instructions  Discharge Instructions     Call MD for:  persistant nausea and vomiting   Complete by: As directed    Diet - low sodium heart healthy   Complete by: As directed    Increase activity slowly   Complete by: As directed       Allergies as of 03/30/2021       Reactions   Toradol [ketorolac Tromethamine] Itching   Doxycycline Hives   Morphine And Related         Medication List     STOP taking these medications    estrogens (conjugated) 0.45 MG tablet Commonly known as: PREMARIN   ibuprofen 800 MG tablet Commonly known as: ADVIL       TAKE these medications    aspirin 81 MG chewable tablet Chew by mouth daily.   atorvastatin 40 MG tablet Commonly known as: LIPITOR Take 40 mg by mouth daily.   cetirizine 10 MG tablet Commonly known as: ZYRTEC Take 10 mg by mouth daily.   clonazePAM 0.5 MG tablet Commonly known as: KLONOPIN Take 0.5 mg by mouth at bedtime as needed for anxiety.   docusate  sodium 100 MG capsule Commonly known as: COLACE Take 100 mg by mouth daily as needed for mild constipation.   ergocalciferol 1.25 MG (50000 UT) capsule Commonly known as: VITAMIN D2 Take 50,000 Units by mouth once a week.   fluticasone 50 MCG/ACT nasal spray Commonly known as: FLONASE Place 2 sprays into both nostrils daily.   folic acid 1 MG tablet Commonly known as: FOLVITE Take 1 mg  by mouth daily.   gabapentin 600 MG tablet Commonly known as: NEURONTIN Take 600 mg by mouth 2 (two) times daily.   metFORMIN 500 MG 24 hr tablet Commonly known as: GLUCOPHAGE-XR Take 500 mg by mouth at bedtime.   methadone 10 MG tablet Commonly known as: DOLOPHINE Take 10 mg by mouth every 8 (eight) hours as needed.   methotrexate 2.5 MG tablet Commonly known as: RHEUMATREX Take 2.5 mg by mouth once a week. Caution:Chemotherapy. Protect from light.Take 5 tablets by mouth in the morning and 5 tablets in the evening once Betty week   oxyCODONE 15 mg 12 hr tablet Commonly known as: OXYCONTIN Take 15 mg by mouth every 12 (twelve) hours as needed.   pantoprazole 40 MG tablet Commonly known as: PROTONIX Take 40 mg by mouth daily.   predniSONE 2.5 MG tablet Commonly known as: DELTASONE Take 2.5 mg by mouth daily with breakfast.   QUEtiapine 100 MG tablet Commonly known as: SEROQUEL Take 100 mg by mouth at bedtime.   Tofacitinib Citrate ER 11 MG Tb24 Take by mouth.   traZODone 50 MG tablet Commonly known as: DESYREL Take 50 mg by mouth at bedtime.        Follow-up Information     Betty Crofts, MD Follow up in 1 week(s).   Specialty: Neurology Contact information: [facility] [place] [address] [phone]         Betty Roys, MD Follow up in 1 week(s).   Specialty: Family Medicine Contact information: [place] [address] [phone]                Allergies  Allergen Reactions   Toradol [Ketorolac Tromethamine] Itching   Doxycycline Hives   Morphine And Related     Consultations: Neurology   Procedures/Studies: CT ANGIO HEAD NECK W WO CM  Result Date: 03/29/2021 CLINICAL DATA:  Initial evaluation for acute stroke, left-sided numbness. EXAM: CT ANGIOGRAPHY HEAD AND NECK TECHNIQUE: Multidetector CT imaging of the head and neck was performed using the standard protocol  during bolus administration of intravenous contrast. Multiplanar CT image reconstructions and MIPs were obtained to evaluate the vascular anatomy. Carotid stenosis measurements (when applicable) are obtained utilizing NASCET criteria, using the distal internal carotid diameter as the denominator. CONTRAST:  116mL OMNIPAQUE IOHEXOL 350 MG/ML SOLN COMPARISON:  Prior CT from earlier the same day. FINDINGS: CTA NECK FINDINGS Aortic arch: Visualized aortic arch normal caliber with normal branch pattern. Scattered atheromatous change about the aortic arch and origin of the great vessels without hemodynamically significant stenosis. Right carotid system: Right CCA patent from its origin to the bifurcation without stenosis. Eccentric calcified plaque at the right carotid bulb/proximal right ICA without significant stenosis. Right ICA patent distally without stenosis, dissection or occlusion. Left carotid system: Left CCA patent from its origin to the bifurcation without stenosis. Scattered plaque about the left carotid bulb/proximal left ICA without significant stenosis. Left ICA patent distally without stenosis, dissection or occlusion. Vertebral arteries: Both vertebral arteries arise from the subclavian  arteries. No significant proximal subclavian artery stenosis. Atheromatous change at the origin of the left vertebral artery with associated moderate to severe stenosis. Vertebral arteries otherwise patent distally without stenosis, dissection or occlusion. Skeleton: No visible acute osseous finding. No discrete or worrisome osseous lesions. Other neck: No other acute soft tissue abnormality within the neck. No mass or adenopathy. Upper chest: Emphysema. Visualized upper chest demonstrates no other acute finding. Review of the MIP images confirms the above findings CTA HEAD FINDINGS Anterior circulation: Petrous segments patent bilaterally. Atheromatous change within the carotid siphons without significant stenosis. A1  segments patent bilaterally. Normal anterior communicating artery complex. Anterior cerebral arteries patent to their distal aspects without stenosis. No M1 stenosis or occlusion. Normal MCA bifurcations. Distal MCA branches well perfused and symmetric. Posterior circulation: Both V4 segments patent to the vertebrobasilar junction without stenosis. Both PICA origins patent and normal. Basilar diffusely diminutive but widely patent to its distal aspect. Superior cerebellar arteries patent bilaterally fetal type origin of the PCAs bilaterally. Both PCAs well perfused to their distal aspects without stenosis. Venous sinuses: Grossly patent allowing for timing the contrast bolus. Anatomic variants: Fetal type origin of the PCAs with associated diminutive vertebrobasilar system. No aneurysm. Review of the MIP images confirms the above findings IMPRESSION: 1. Negative CTA for emergent large vessel occlusion. 2. Atheromatous change about the origin of the left vertebral artery with associated moderate to severe stenosis. 3. Additional mild for age atheromatous change elsewhere about the major arterial vasculature of the head and neck as above. No other hemodynamically significant or correctable stenosis. 4. Fetal type origin of the PCAs with associated diminutive vertebrobasilar system. 5. Aortic Atherosclerosis (ICD10-I70.0) and Emphysema (ICD10-J43.9). Electronically Signed   By: Jeannine Boga M.D.   On: 03/29/2021 23:36   MR BRAIN WO CONTRAST  Result Date: 03/30/2021 CLINICAL DATA:  Headache and right upper extremity numbness EXAM: MRI HEAD WITHOUT CONTRAST TECHNIQUE: Multiplanar, multiecho pulse sequences of the brain and surrounding structures were obtained without intravenous contrast. COMPARISON:  CT studies done yesterday. FINDINGS: Brain: The brain has a normal appearance without evidence of malformation, atrophy, old or acute small or large vessel infarction, mass lesion, hemorrhage, hydrocephalus or  extra-axial collection. Vascular: Major vessels at the base of the brain show flow. Venous sinuses appear patent. Skull and upper cervical spine: Normal. Sinuses/Orbits: Clear/normal. Other: None significant. IMPRESSION: Normal examination. No abnormality seen to explain the presenting symptoms. Electronically Signed   By: Nelson Chimes M.D.   On: 03/30/2021 15:10   US Venous Img Lower Unilateral Left (DVT)  Result Date: 03/30/2021 CLINICAL DATA:  Left leg pain and edema EXAM: LEFT LOWER EXTREMITY VENOUS DOPPLER ULTRASOUND TECHNIQUE: Gray-scale sonography with graded compression, as well as color Doppler and duplex ultrasound were performed to evaluate the lower extremity deep venous systems from the level of the common femoral vein and including the common femoral, femoral, profunda femoral, popliteal and calf veins including the posterior tibial, peroneal and gastrocnemius veins when visible. The superficial great saphenous vein was also interrogated. Spectral Doppler was utilized to evaluate flow at rest and with distal augmentation maneuvers in the common femoral, femoral and popliteal veins. COMPARISON:  None. FINDINGS: Contralateral Common Femoral Vein: Respiratory phasicity is normal and symmetric with the symptomatic side. No evidence of thrombus. Normal compressibility. Common Femoral Vein: No evidence of thrombus. Normal compressibility, respiratory phasicity and response to augmentation. Saphenofemoral Junction: No evidence of thrombus. Normal compressibility and flow on color Doppler imaging. Profunda Femoral Vein: No evidence of  thrombus. Normal compressibility and flow on color Doppler imaging. Femoral Vein: No evidence of thrombus. Normal compressibility, respiratory phasicity and response to augmentation. Popliteal Vein: No evidence of thrombus. Normal compressibility, respiratory phasicity and response to augmentation. Calf Veins: No evidence of thrombus. Normal compressibility and flow on color  Doppler imaging. IMPRESSION: No evidence of deep venous thrombosis. Electronically Signed   By: Jerilynn Mages.  Shick M.D.   On: 03/30/2021 15:40   CT ANGIO CHEST AORTA W/CM & OR WO/CM  Result Date: 03/29/2021 CLINICAL DATA:  Aortic disease. Shortness of breath. Left arm numbness. EXAM: CT ANGIOGRAPHY CHEST WITH CONTRAST TECHNIQUE: Multidetector CT imaging of the chest was performed using the standard protocol during bolus administration of intravenous contrast. Multiplanar CT image reconstructions and MIPs were obtained to evaluate the vascular anatomy. CONTRAST:  158mL OMNIPAQUE IOHEXOL 350 MG/ML SOLN COMPARISON:  None. FINDINGS: Cardiovascular: Heart is normal size. Aorta is normal caliber. Scattered coronary artery and aortic calcifications. Study was not performed to optimally opacify the pulmonary arteries, but knows large or central pulmonary emboli noted. Mediastinum/Nodes: No mediastinal, hilar, or axillary adenopathy. Trachea and esophagus are unremarkable. Thyroid unremarkable. Lungs/Pleura: Mild centrilobular and paraseptal emphysema. No confluent opacities or effusions. Upper Abdomen: Imaging into the upper abdomen demonstrates no acute findings. Prior cholecystectomy. Dilated common bile duct measuring 14 mm, likely related to post cholecystectomy state. Musculoskeletal: Chest wall soft tissues are unremarkable. No acute bony abnormality. Review of the MIP images confirms the above findings. IMPRESSION: No evidence of aortic aneurysm or dissection. Scattered coronary artery calcifications. Aortic Atherosclerosis (ICD10-I70.0) and Emphysema (ICD10-J43.9). Electronically Signed   By: Rolm Baptise M.D.   On: 03/29/2021 23:21   CT HEAD CODE STROKE WO CONTRAST  Result Date: 03/29/2021 CLINICAL DATA:  Code stroke. Initial evaluation for acute left-sided numbness, facial numbness. EXAM: CT HEAD WITHOUT CONTRAST TECHNIQUE: Contiguous axial images were obtained from the base of the skull through the vertex without  intravenous contrast. COMPARISON:  Prior CT from 02/11/2018. FINDINGS: Brain: Cerebral volume within normal limits for patient age. No evidence for acute intracranial hemorrhage. No findings to suggest acute large vessel territory infarct. No mass lesion, midline shift, or mass effect. Ventricles are normal in size without evidence for hydrocephalus. No extra-axial fluid collection identified. Vascular: No hyperdense vessel identified. Skull: Scalp soft tissues demonstrate no acute abnormality. Calvarium intact. Sinuses/Orbits: Globes and orbital soft tissues within normal limits. Visualized paranasal sinuses are clear. Trace right mastoid effusion noted. ASPECTS [facility] Stroke Program Early CT Score) - Ganglionic level infarction (caudate, lentiform nuclei, internal capsule, insula, M1-M3 cortex): 7 - Supraganglionic infarction (M4-M6 cortex): 3 Total score (0-10 with 10 being normal): 10 IMPRESSION: 1. Negative head CT.  No acute intracranial abnormality. 2. ASPECTS is 10. Critical Value/emergent results were called by telephone at the time of interpretation on 03/29/2021 at 10:02 pm to provider Merlyn Lot , who verbally acknowledged these results. Electronically Signed   By: Jeannine Boga M.D.   On: 03/29/2021 22:04      Subjective: No shortness of breath or dizziness  Discharge Exam: Vitals:   03/30/21 0807 03/30/21 1138  BP: 136/67 (!) 159/77  Pulse: (!) 53 62  Resp: 18 16  Temp: 98.1 F (36.7 C) 97.8 F (36.6 C)  SpO2: 100% 100%   Vitals:   03/30/21 0359 03/30/21 0609 03/30/21 0807 03/30/21 1138  BP: (!) 152/85 135/71 136/67 (!) 159/77  Pulse: (!) 51 60 (!) 53 62  Resp: 16 16 18 16   Temp: (!) 97.4 F (36.3  C) 97.6 F (36.4 C) 98.1 F (36.7 C) 97.8 F (36.6 C)  TempSrc: Oral Oral    SpO2: 100% 100% 100% 100%  Weight:      Height:        General: Pt is alert, awake, not in acute distress Cardiovascular: RRR, S1/S2 +, no rubs, no gallops Respiratory: CTA  bilaterally, no wheezing, no rhonchi Abdominal: Soft, NT, ND, bowel sounds + Extremities: Mild left lower extremity edema.    The results of significant diagnostics from this hospitalization (including imaging, microbiology, ancillary and laboratory) are listed below for reference.     Microbiology: Recent Results (from the past 240 hour(s))  SARS CORONAVIRUS 2 (TAT 6-24 HRS) Nasopharyngeal Nasopharyngeal Swab     Status: None   Collection Time: 03/29/21 11:57 PM   Specimen: Nasopharyngeal Swab  Result Value Ref Range Status   SARS Coronavirus 2 NEGATIVE NEGATIVE Final    Comment: (NOTE) SARS-CoV-2 target nucleic acids are NOT DETECTED.  The SARS-CoV-2 RNA is generally detectable in upper and lower respiratory specimens during the acute phase of infection. Negative results do not preclude SARS-CoV-2 infection, do not rule out co-infections with other pathogens, and should not be used as the sole basis for treatment or other patient management decisions. Negative results must be combined with clinical observations, patient history, and epidemiological information. The expected result is Negative.  Fact Sheet for Patients: SugarRoll.be  Fact Sheet for Healthcare Providers: https://www.woods-mathews.com/  This test is not yet approved or cleared by the [place] FDA and  has been authorized for detection and/or diagnosis of SARS-CoV-2 by FDA under an Emergency Use Authorization (EUA). This EUA will remain  in effect (meaning this test can be used) for the duration of the COVID-19 declaration under Se ction 564(b)(1) of the Act, 21 U.S.C. section 360bbb-3(b)(1), unless the authorization is terminated or revoked sooner.  Performed at [facility], [place] [address], [place], [place] [address]      Labs: BNP (last 3 results) No results for input(s): BNP in the last 8760 hours. Basic Metabolic Panel: Recent Labs  Lab  03/29/21 2200 03/30/21 0232  NA 136 136  K 4.6 3.7  CL 106 108  CO2 23 23  GLUCOSE 96 114*  BUN 20 18  CREATININE 0.93 0.74  CALCIUM 8.6* 8.2*   Liver Function Tests: Recent Labs  Lab 03/29/21 2200  AST 20  ALT 12  ALKPHOS 42  BILITOT 0.5  PROT 6.4*  ALBUMIN 3.5   No results for input(s): LIPASE, AMYLASE in the last 168 hours. No results for input(s): AMMONIA in the last 168 hours. CBC: Recent Labs  Lab 03/29/21 2200 03/30/21 0232  WBC 6.3 5.4  NEUTROABS 3.4  --   HGB 14.0 12.4  HCT 43.3 37.6  MCV 95.6 93.1  PLT 164 150   Cardiac Enzymes: No results for input(s): CKTOTAL, CKMB, CKMBINDEX, TROPONINI in the last 168 hours. BNP: Invalid input(s): POCBNP CBG: Recent Labs  Lab 03/29/21 2135  GLUCAP 96   D-Dimer No results for input(s): DDIMER in the last 72 hours. Hgb A1c No results for input(s): HGBA1C in the last 72 hours. Lipid Profile Recent Labs    03/30/21 0232  CHOL 172  HDL 57  LDLCALC 103*  TRIG 62  CHOLHDL 3.0   Thyroid function studies No results for input(s): TSH, T4TOTAL, T3FREE, THYROIDAB in the last 72 hours.  Invalid input(s): FREET3 Anemia work up No results for input(s): VITAMINB12, FOLATE, FERRITIN, TIBC, IRON, RETICCTPCT in  the last 72 hours. Urinalysis    Component Value Date/Time   COLORURINE COLORLESS (A) 01/21/2020 2325   APPEARANCEUR CLEAR (A) 01/21/2020 2325   LABSPEC 1.002 (L) 01/21/2020 2325   PHURINE 6.0 01/21/2020 2325   GLUCOSEU NEGATIVE 01/21/2020 2325   HGBUR NEGATIVE 01/21/2020 2325   BILIRUBINUR NEGATIVE 01/21/2020 2325   KETONESUR NEGATIVE 01/21/2020 2325   PROTEINUR NEGATIVE 01/21/2020 2325   NITRITE NEGATIVE 01/21/2020 2325   LEUKOCYTESUR NEGATIVE 01/21/2020 2325   Sepsis Labs Invalid input(s): PROCALCITONIN,  WBC,  LACTICIDVEN Microbiology Recent Results (from the past 240 hour(s))  SARS CORONAVIRUS 2 (TAT 6-24 HRS) Nasopharyngeal Nasopharyngeal Swab     Status: None   Collection Time: 03/29/21  11:57 PM   Specimen: Nasopharyngeal Swab  Result Value Ref Range Status   SARS Coronavirus 2 NEGATIVE NEGATIVE Final    Comment: (NOTE) SARS-CoV-2 target nucleic acids are NOT DETECTED.  The SARS-CoV-2 RNA is generally detectable in upper and lower respiratory specimens during the acute phase of infection. Negative results do not preclude SARS-CoV-2 infection, do not rule out co-infections with other pathogens, and should not be used as the sole basis for treatment or other patient management decisions. Negative results must be combined with clinical observations, patient history, and epidemiological information. The expected result is Negative.  Fact Sheet for Patients: SugarRoll.be  Fact Sheet for Healthcare Providers: https://www.woods-mathews.com/  This test is not yet approved or cleared by the [place] FDA and  has been authorized for detection and/or diagnosis of SARS-CoV-2 by FDA under an Emergency Use Authorization (EUA). This EUA will remain  in effect (meaning this test can be used) for the duration of the COVID-19 declaration under Se ction 564(b)(1) of the Act, 21 U.S.C. section 360bbb-3(b)(1), unless the authorization is terminated or revoked sooner.  Performed at [facility], [place] [address], [place], [place] [address]      Time coordinating discharge: Over 30 minutes  SIGNED:   Nolberto Hanlon, MD  [facility] 03/30/2021, 4:32 PM Pager   If 7PM-7AM, please contact night-coverage [email or web address] Password [email or web address]

## 2021-03-30 NOTE — Evaluation (Signed)
Occupational Therapy Evaluation Patient Details Name: Betty Ferguson MRN: [id number] DOB: 1961/07/09 Today's Date: 03/30/2021    History of Present Illness 60 y.o. female with medical history significant for fibromyalgia, lupus, migraine headache, rheumatoid arthritis and history of pancreatic and liver cancer status postsurgical resection, who presented to the emergency room with acute onset of headache with associated right upper extremity numbness and weakness as well as right shoulder pain.  She admitted to associated dizziness with her headache. Imaging negative for acute infarct.   Clinical Impression   Pt was seen for OT evaluation this date. Prior to hospital admission, pt was using RW vs SPC intermittently 2/2 L hip pain, able to manage with dressing, toileting, and bathing needs most of the time but does endorse difficulty with tub transfers and LB dressing, occasionally getting assist from daughter for LB dressing. Pt lives with her significant other who works. Currently pt demonstrates impairments as described below (See OT problem list) which functionally limit her ability to perform ADL/self-care tasks. Very pain limited (L hip sharp and shooting down the LLE) and nauseated. Provided with blue emesis bag and RN notified promptly. Pt able to reposition herself with bed flat and use of bed rails. Pillow placed under pt's L hip with pt endorsing very mild improvement in comfort. Appreciative of efforts. Anticipate with improved pain control and decreased nausea, pt will be better able to perform mobility and LB ADL tasks. Currently pt requires MIN A for LB ADL 2/2 L hip pain. Pt would benefit from skilled OT services to address noted impairments and functional limitations (see below for any additional details) in order to maximize safety and independence while minimizing falls risk and caregiver burden. Pending medical improvement and better pain control, upon hospital discharge, recommend HHOT to  maximize pt safety and return to functional independence during meaningful occupations of daily life.    Follow Up Recommendations  Home health OT;Other (comment) (pending additional progress)    Equipment Recommendations  Tub/shower bench    Recommendations for Other Services       Precautions / Restrictions Precautions Precautions: Fall Restrictions Weight Bearing Restrictions: No      Mobility Bed Mobility               General bed mobility comments: with bed flat, pt able to pull herself up for improved positioning in hopes to help her severe L hip/LE pain, no assist from therapist, heavy use of bed rails    Transfers                 General transfer comment: NT, pt unable to tolerate, nauseated, wretching    Balance                                           ADL either performed or assessed with clinical judgement   ADL Overall ADL's : Needs assistance/impaired                                       General ADL Comments: Pt pain limited and nauseated, anticipate MIN A for LB ADL 2/2 L hip/LLE pain, will continue to assess as pt able to tolerate; no difficulty with bed level grooming, self feeding     Vision Baseline Vision/History: Wears glasses Wears Glasses: At all times (  does not have with her) Patient Visual Report: No change from baseline Vision Assessment?: No apparent visual deficits     Perception     Praxis      Pertinent Vitals/Pain Pain Assessment: 0-10 Pain Score: 10-Worst pain ever Pain Location: L hip shooting down L leg Pain Descriptors / Indicators: Radiating;Sharp;Shooting;Grimacing;Crying Pain Intervention(s): Limited activity within patient's tolerance;Monitored during session;Repositioned;Patient requesting pain meds-RN notified     Hand Dominance     Extremity/Trunk Assessment Upper Extremity Assessment Upper Extremity Assessment: Overall WFL for tasks assessed (able to use BUE to  pull herself up in bed, limited formal assessment 2/2 nausea)   Lower Extremity Assessment Lower Extremity Assessment: Defer to PT evaluation (limited formal assessment 2/2 nausea)       Communication Communication Communication: No difficulties   Cognition Arousal/Alertness: Awake/alert Behavior During Therapy: WFL for tasks assessed/performed Overall Cognitive Status: Within Functional Limits for tasks assessed                                 General Comments: tearful, in pain, nauseated   General Comments  Pt very nauseated, blue emesis bag provided, RN notified    Exercises Other Exercises Other Exercises: pt educated in tub transfer bench to improve tub transfer safety at home, pt also educated in additional AE/DME to support safety wiht bathing   Shoulder Instructions      Home Living Family/patient expects to be discharged to:: Private residence Living Arrangements: Spouse/significant other Available Help at Discharge: Family;Available PRN/intermittently (partner works, pt's dtr available PRN) Type of Home: House Home Access: Stairs to enter [employer] of Steps: 2   [place]: One level     Bathroom Shower/Tub: Teacher, early years/pre: [place]: Environmental consultant - 2 wheels;Cane - single point;Bedside commode;Grab bars - tub/shower;Grab bars - toilet          Prior Functioning/Environment Level of Independence: Needs assistance  Gait / Transfers Assistance Needed: Pt ambulating with RW vs SPC PRN 2/2 L hip pain ADL's / Homemaking Assistance Needed: Pt reports difficulty with LB ADL, often requires assist from family for LB dressing, difficulty with tub transfers, indep with med mgt, able to drive but hasn't recently 2/2 pain and waiting on new glasses to be delivered            OT Problem List: Pain;Decreased strength;Decreased knowledge of use of DME or AE      OT Treatment/Interventions: Self-care/ADL  training;Therapeutic exercise;Therapeutic activities;DME and/or AE instruction;Patient/family education;Balance training    OT Goals(Current goals can be found in the care plan section) Acute Rehab OT Goals Patient Stated Goal: have less pain OT Goal Formulation: With patient Time For Goal Achievement: 04/13/21 Potential to Achieve Goals: Good ADL Goals Pt Will Perform Lower Body Dressing: with modified independence;sit to/from stand;with adaptive equipment Pt Will Transfer to Toilet: with modified independence;ambulating (LRAD for amb, elevated commode) Additional ADL Goal #1: Pt will perform seated UB/LB bathing with PRN AE and modified independence to minimize chronic back pain.  OT Frequency: Min 2X/week   Barriers to D/C:            Co-evaluation              AM-PAC OT "6 Clicks" Daily Activity     Outcome Measure Help from another person eating meals?: None Help from another person taking care of personal grooming?: None Help from another  person toileting, which includes using toliet, bedpan, or urinal?: A Little Help from another person bathing (including washing, rinsing, drying)?: A Little Help from another person to put on and taking off regular upper body clothing?: None Help from another person to put on and taking off regular lower body clothing?: A Little 6 Click Score: 21   End of Session Nurse Communication: Patient requests pain meds;Other (comment) (nausea)  Activity Tolerance: Patient limited by pain;Treatment limited secondary to medical complications (Comment) (nausea) Patient left: in bed;with call bell/phone within reach;with bed alarm set  OT Visit Diagnosis: Other abnormalities of gait and mobility (R26.89);Pain;Muscle weakness (generalized) (M62.81) Pain - Right/Left: Left Pain - part of body: Hip;Leg                Time: 4720-7218 OT Time Calculation (min): 15 min Charges:  OT General Charges $OT Visit: 1 Visit OT Evaluation $OT Eval  Moderate Complexity: 1 Mod  [name], MPH, MS, OTR/L ascom [phone] 03/30/21, 4:45 PM

## 2021-03-30 NOTE — H&P (Signed)
[place]   PATIENT NAME: Betty Ferguson    MR#:  [id number]  DATE OF BIRTH:  08-Feb-1961  DATE OF ADMISSION:  03/29/2021  PRIMARY CARE PHYSICIAN: Youlanda Roys, MD   Patient is coming from: Home  REQUESTING/REFERRING PHYSICIAN: Merlyn Lot, MD  CHIEF COMPLAINT:   Chief Complaint  Patient presents with  . Numbness    HISTORY OF PRESENT ILLNESS:  Betty Ferguson is a 60 y.o. African-American female with medical history significant for fibromyalgia, lupus, migraine headache, rheumatoid arthritis and history of pancreatic and liver cancer status postsurgical resection, who presented to the emergency room, who presented to the emergency room with acute onset of headache with associated right upper extremity numbness and weakness as well as right shoulder pain.  She admitted to associated dizziness with her headache.  She denied any lower extremity weakness or numbness.  She denied dysphagia or dysarthria.  No tinnitus or vertigo.  No urinary or stool incontinence.  No dyspnea or cough or chest pain or palpitations.  She stated that she has not had a migraine in years and it was different from this headache.  ED Course: Upon presenting to the emergency room heart rate was 59 with otherwise normal vital signs.  Labs revealed unremarkable CMP and CBC. EKG as reviewed by me : showed sinus bradycardia with a rate of 55 with poor R wave progression. Imaging: Head and neck CTA revealed: 1. Negative CTA for emergent large vessel occlusion. 2. Atheromatous change about the origin of the left vertebral artery with associated moderate to severe stenosis. 3. Additional mild for age atheromatous change elsewhere about the major arterial vasculature of the head and neck as above. No other hemodynamically significant or correctable stenosis. 4. Fetal type origin of the PCAs with associated diminutive vertebrobasilar system. 5. Aortic Atherosclerosis and emphysema.  Chest CTA revealed no  evidence for aortic dissection or aneurysm.  It showed scattered coronary artery calcifications aortic atherosclerosis and emphysema.  The patient was given 1 g p.o. Tylenol and 12.5 mg IV Benadryl and 10 mg of IV Compazine. We added Plavix to aspirin.  She will be admitted to an observation medically monitored bed for further evaluation and management. PAST MEDICAL HISTORY:   Past Medical History:  Diagnosis Date  . Cancer [facility])    pancreatic  . Collagen vascular disease ([place])   . Fibromyalgia   . H/O liver cancer   . Lupus ([place])   . Rheumatoid arthritis ([place])     PAST SURGICAL HISTORY:   Past Surgical History:  Procedure Laterality Date  . BACK SURGERY    . CHOLECYSTECTOMY    . LIVER SURGERY    . PANCREAS SURGERY    . PANCREATECTOMY    . ROTATOR CUFF REPAIR Right   . TOTAL ABDOMINAL HYSTERECTOMY      SOCIAL HISTORY:   Social History   Tobacco Use  . Smoking status: Former    Pack years: 0.00  . Smokeless tobacco: Never  Substance Use Topics  . Alcohol use: No    FAMILY HISTORY:  Positive for hypertension and diabetes mellitus.  DRUG ALLERGIES:   Allergies  Allergen Reactions  . Toradol [Ketorolac Tromethamine] Itching  . Doxycycline Hives  . Morphine And Related     REVIEW OF SYSTEMS:   ROS As per history of present illness. All pertinent systems were reviewed above. Constitutional, HEENT, cardiovascular, respiratory, GI, GU, musculoskeletal, neuro, psychiatric, endocrine, integumentary and hematologic systems were reviewed and are otherwise negative/unremarkable  except for positive findings mentioned above in the HPI.   MEDICATIONS AT HOME:   Prior to Admission medications   Not on File      VITAL SIGNS:  Blood pressure (!) 153/67, pulse 62, temperature 97.6 F (36.4 C), temperature source Oral, resp. rate 20, height 5\' 2"  (1.575 m), weight 76.7 kg, SpO2 100 %.  PHYSICAL EXAMINATION:  Physical Exam  GENERAL:  60 y.o.-year-old patient  African-American female lying in the bed with no acute distress.  EYES: Pupils equal, round, reactive to light and accommodation. No scleral icterus. Extraocular muscles intact.  HEENT: Head atraumatic, normocephalic. Oropharynx and nasopharynx clear.  NECK:  Supple, no jugular venous distention. No thyroid enlargement, no tenderness.  LUNGS: Normal breath sounds bilaterally, no wheezing, rales,rhonchi or crepitation. No use of accessory muscles of respiration.  CARDIOVASCULAR: Regular rate and rhythm, S1, S2 normal. No murmurs, rubs, or gallops.  ABDOMEN: Soft, nondistended, nontender. Bowel sounds present. No organomegaly or mass.  EXTREMITIES: No pedal edema, cyanosis, or clubbing.  NEUROLOGIC: Cranial nerves II through XII are intact. Muscle strength 5/5 in all extremities. Sensation intact. Gait not checked.  PSYCHIATRIC: The patient is alert and oriented x 3.  Normal affect and good eye contact. SKIN: No obvious rash, lesion, or ulcer.   LABORATORY PANEL:   CBC Recent Labs  Lab 03/29/21 2200  WBC 6.3  HGB 14.0  HCT 43.3  PLT 164   ------------------------------------------------------------------------------------------------------------------  Chemistries  Recent Labs  Lab 03/29/21 2200  NA 136  K 4.6  CL 106  CO2 23  GLUCOSE 96  BUN 20  CREATININE 0.93  CALCIUM 8.6*  AST 20  ALT 12  ALKPHOS 42  BILITOT 0.5   ------------------------------------------------------------------------------------------------------------------  Cardiac Enzymes No results for input(s): TROPONINI in the last 168 hours. ------------------------------------------------------------------------------------------------------------------  RADIOLOGY:  CT ANGIO HEAD NECK W WO CM  Result Date: 03/29/2021 CLINICAL DATA:  Initial evaluation for acute stroke, left-sided numbness. EXAM: CT ANGIOGRAPHY HEAD AND NECK TECHNIQUE: Multidetector CT imaging of the head and neck was performed using the  standard protocol during bolus administration of intravenous contrast. Multiplanar CT image reconstructions and MIPs were obtained to evaluate the vascular anatomy. Carotid stenosis measurements (when applicable) are obtained utilizing NASCET criteria, using the distal internal carotid diameter as the denominator. CONTRAST:  127mL OMNIPAQUE IOHEXOL 350 MG/ML SOLN COMPARISON:  Prior CT from earlier the same day. FINDINGS: CTA NECK FINDINGS Aortic arch: Visualized aortic arch normal caliber with normal branch pattern. Scattered atheromatous change about the aortic arch and origin of the great vessels without hemodynamically significant stenosis. Right carotid system: Right CCA patent from its origin to the bifurcation without stenosis. Eccentric calcified plaque at the right carotid bulb/proximal right ICA without significant stenosis. Right ICA patent distally without stenosis, dissection or occlusion. Left carotid system: Left CCA patent from its origin to the bifurcation without stenosis. Scattered plaque about the left carotid bulb/proximal left ICA without significant stenosis. Left ICA patent distally without stenosis, dissection or occlusion. Vertebral arteries: Both vertebral arteries arise from the subclavian arteries. No significant proximal subclavian artery stenosis. Atheromatous change at the origin of the left vertebral artery with associated moderate to severe stenosis. Vertebral arteries otherwise patent distally without stenosis, dissection or occlusion. Skeleton: No visible acute osseous finding. No discrete or worrisome osseous lesions. Other neck: No other acute soft tissue abnormality within the neck. No mass or adenopathy. Upper chest: Emphysema. Visualized upper chest demonstrates no other acute finding. Review of the MIP images confirms the above  findings CTA HEAD FINDINGS Anterior circulation: Petrous segments patent bilaterally. Atheromatous change within the carotid siphons without  significant stenosis. A1 segments patent bilaterally. Normal anterior communicating artery complex. Anterior cerebral arteries patent to their distal aspects without stenosis. No M1 stenosis or occlusion. Normal MCA bifurcations. Distal MCA branches well perfused and symmetric. Posterior circulation: Both V4 segments patent to the vertebrobasilar junction without stenosis. Both PICA origins patent and normal. Basilar diffusely diminutive but widely patent to its distal aspect. Superior cerebellar arteries patent bilaterally fetal type origin of the PCAs bilaterally. Both PCAs well perfused to their distal aspects without stenosis. Venous sinuses: Grossly patent allowing for timing the contrast bolus. Anatomic variants: Fetal type origin of the PCAs with associated diminutive vertebrobasilar system. No aneurysm. Review of the MIP images confirms the above findings IMPRESSION: 1. Negative CTA for emergent large vessel occlusion. 2. Atheromatous change about the origin of the left vertebral artery with associated moderate to severe stenosis. 3. Additional mild for age atheromatous change elsewhere about the major arterial vasculature of the head and neck as above. No other hemodynamically significant or correctable stenosis. 4. Fetal type origin of the PCAs with associated diminutive vertebrobasilar system. 5. Aortic Atherosclerosis (ICD10-I70.0) and Emphysema (ICD10-J43.9). Electronically Signed   By: Jeannine Boga M.D.   On: 03/29/2021 23:36   CT ANGIO CHEST AORTA W/CM & OR WO/CM  Result Date: 03/29/2021 CLINICAL DATA:  Aortic disease. Shortness of breath. Left arm numbness. EXAM: CT ANGIOGRAPHY CHEST WITH CONTRAST TECHNIQUE: Multidetector CT imaging of the chest was performed using the standard protocol during bolus administration of intravenous contrast. Multiplanar CT image reconstructions and MIPs were obtained to evaluate the vascular anatomy. CONTRAST:  157mL OMNIPAQUE IOHEXOL 350 MG/ML SOLN  COMPARISON:  None. FINDINGS: Cardiovascular: Heart is normal size. Aorta is normal caliber. Scattered coronary artery and aortic calcifications. Study was not performed to optimally opacify the pulmonary arteries, but knows large or central pulmonary emboli noted. Mediastinum/Nodes: No mediastinal, hilar, or axillary adenopathy. Trachea and esophagus are unremarkable. Thyroid unremarkable. Lungs/Pleura: Mild centrilobular and paraseptal emphysema. No confluent opacities or effusions. Upper Abdomen: Imaging into the upper abdomen demonstrates no acute findings. Prior cholecystectomy. Dilated common bile duct measuring 14 mm, likely related to post cholecystectomy state. Musculoskeletal: Chest wall soft tissues are unremarkable. No acute bony abnormality. Review of the MIP images confirms the above findings. IMPRESSION: No evidence of aortic aneurysm or dissection. Scattered coronary artery calcifications. Aortic Atherosclerosis (ICD10-I70.0) and Emphysema (ICD10-J43.9). Electronically Signed   By: Rolm Baptise M.D.   On: 03/29/2021 23:21   CT HEAD CODE STROKE WO CONTRAST  Result Date: 03/29/2021 CLINICAL DATA:  Code stroke. Initial evaluation for acute left-sided numbness, facial numbness. EXAM: CT HEAD WITHOUT CONTRAST TECHNIQUE: Contiguous axial images were obtained from the base of the skull through the vertex without intravenous contrast. COMPARISON:  Prior CT from 02/11/2018. FINDINGS: Brain: Cerebral volume within normal limits for patient age. No evidence for acute intracranial hemorrhage. No findings to suggest acute large vessel territory infarct. No mass lesion, midline shift, or mass effect. Ventricles are normal in size without evidence for hydrocephalus. No extra-axial fluid collection identified. Vascular: No hyperdense vessel identified. Skull: Scalp soft tissues demonstrate no acute abnormality. Calvarium intact. Sinuses/Orbits: Globes and orbital soft tissues within normal limits. Visualized  paranasal sinuses are clear. Trace right mastoid effusion noted. ASPECTS [facility] Stroke Program Early CT Score) - Ganglionic level infarction (caudate, lentiform nuclei, internal capsule, insula, M1-M3 cortex): 7 - Supraganglionic infarction (M4-M6 cortex): 3 Total score (  0-10 with 10 being normal): 10 IMPRESSION: 1. Negative head CT.  No acute intracranial abnormality. 2. ASPECTS is 10. Critical Value/emergent results were called by telephone at the time of interpretation on 03/29/2021 at 10:02 pm to provider Merlyn Lot , who verbally acknowledged these results. Electronically Signed   By: Jeannine Boga M.D.   On: 03/29/2021 22:04      IMPRESSION AND PLAN:  Active Problems:   TIA (transient ischemic attack)  1.  Headache with associated right upper extremity numbness, rule out TIA with differential diagnosis including atypical migraine with right shoulder pain. - The patient will be admitted to an observation medically monitored bed. - We will follow neurochecks every 4 hours for 24 hours. - PT/OT and ST consults will be obtained. - Neurology consult will be obtained. - We will add Lasix to her aspirin. - We will check fasting lipids and resume  statin therapy. - Pain management will be provided.  2.  Dyslipidemia. - We will continue statin therapy and check fasting lipids.  3.  Type 2 diabetes mellitus with peripheral neuropathy. - We will continue his on supplement coverage with NovoLog. - We will hold off metformin XR. - We will continue Neurontin.  4.  Collagen vascular disease with lupus and rheumatoid arthritis. - We will continue methotrexate and Xeljanz.  5.  Chronic pain. - We will continue methadone.  6.  GERD. - We will continue PPI therapy.  DVT prophylaxis: Lovenox. Code Status: full code. Family Communication:  The plan of care was discussed in details with the patient (and family). I answered all questions. The patient agreed to proceed with the above  mentioned plan. Further management will depend upon hospital course. Disposition Plan: Back to previous home environment Consults called: Neurology All the records are reviewed and case discussed with ED provider.  Status is: Observation  The patient remains OBS appropriate and will d/c before 2 midnights.  Dispo: The patient is from: Home              Anticipated d/c is to: Home              Patient currently is not medically stable to d/c.   Difficult to place patient No  TOTAL TIME TAKING CARE OF THIS PATIENT: 55 minutes.    Christel Mormon M.D on 03/30/2021 at 12:21 AM  [facility]   From 7 PM-7 AM, contact night-coverage [email or web address]  CC: Primary care physician; Youlanda Roys, MD

## 2021-03-30 NOTE — Progress Notes (Signed)
OT Cancellation Note  Patient Details Name: Betty Ferguson MRN: [id number] DOB: 03-07-1961   Cancelled Treatment:    Reason Eval/Treat Not Completed: Patient at procedure or test/ unavailable. Consult received, chart reviewed. Per RN, going for MRI now and now has an order for a LLE ultrasound to rule out a DVT. Will hold OT and re-attempt OT evaluation at later date/time as pt is available and medically appropriate.   [name], MPH, MS, OTR/L ascom [phone] 03/30/21, 2:15 PM

## 2021-03-30 NOTE — Progress Notes (Signed)
Anticoagulation monitoring(Lovenox):  60 yo female ordered Lovenox 30 mg Q24h    Filed Weights   03/29/21 2125  Weight: 76.7 kg (169 lb)   BMI 30.91   Lab Results  Component Value Date   CREATININE 0.93 03/29/2021   CREATININE 0.78 01/21/2020   CREATININE 0.88 12/17/2019   Estimated Creatinine Clearance: 61.6 mL/min (by C-G formula based on SCr of 0.93 mg/dL). Hemoglobin & Hematocrit     Component Value Date/Time   HGB 14.0 03/29/2021 2200   HCT 43.3 03/29/2021 2200     Per Protocol for Patient with estCrcl > 30 ml/min and BMI > 30, will transition to Lovenox 37.5 mg Q24h.

## 2021-03-30 NOTE — Evaluation (Signed)
Physical Therapy Evaluation Patient Details Name: Betty Ferguson MRN: [id number] DOB: [birth year] Today's Date: 03/30/2021   History of Present Illness  60 y.o. female with medical history significant for fibromyalgia, lupus, migraine headache, rheumatoid arthritis and history of pancreatic and liver cancer status postsurgical resection, who presented to the emergency room with acute onset of headache with associated right upper extremity numbness and weakness as well as right shoulder pain.  She admitted to associated dizziness with her headache.  Admitted for TIA/CVA work up.. Imaging negative for acute infarct.  Clinical Impression  Patient resting in bed upon arrival to room; alert and oriented, agreeable to participation with session.  Endorses "charlie horse" type pain in L LE at rest; RN informed/aware and meds received prior to session. Mild weakness noted to L hip with isolated strength testing (history of previous orthopedic injury per patient); unchanged with this admission per patient.  Of note, some degree of give-way weakness noted during isolated strength testing.  Able to complete bed mobility with mod indep; sit/stand, basic transfers and gait (25') with RW, cga/close sup.  Demonstrates step to gait pattern leading with L LE; cautious and purposeful advancement/placement of L LE. Supports body weight without buckling; mod reliance on RW.  Do recommend continued use of RW with all mobility at this time. Would benefit from skilled PT to address above deficits and promote optimal return to PLOF. Recommend transition to [facility] upon discharge from acute hospitalization.     Follow Up Recommendations Home health PT    Equipment Recommendations  Rolling walker with 5" wheels    Recommendations for Other Services       Precautions / Restrictions Precautions Precautions: Fall Restrictions Weight Bearing Restrictions: No      Mobility  Bed Mobility Overal bed mobility: Modified  Independent                  Transfers Overall transfer level: Needs assistance Equipment used: Rolling walker (2 wheeled) Transfers: Sit to/from Stand Sit to Stand: Supervision;Min guard         General transfer comment: cuing for hand placement  Ambulation/Gait Ambulation/Gait assistance: Min guard;Supervision Gait Distance (Feet): 25 Feet Assistive device: Rolling walker (2 wheeled)       General Gait Details: step to gait pattern leading with L LE; cautious and purposeful advancement/placement of L LE. Supports body weight without buckling; mod reliance on RW.  Do recommend continued use of RW with all mobility at this time.  Stairs            Wheelchair Mobility    Modified Rankin (Stroke Patients Only)       Balance Overall balance assessment: Needs assistance Sitting-balance support: Feet supported;No upper extremity supported Sitting balance-Leahy Scale: Good     Standing balance support: Bilateral upper extremity supported Standing balance-Leahy Scale: Fair                               Pertinent Vitals/Pain Pain Assessment: Faces Faces Pain Scale: Hurts even more Pain Location: L LE "like a charlie horse" Pain Descriptors / Indicators: Grimacing;Guarding Pain Intervention(s): Limited activity within patient's tolerance;Monitored during session;Premedicated before session;Repositioned    Home Living Family/patient expects to be discharged to:: Private residence Living Arrangements: Spouse/significant other Available Help at Discharge: Family;Available PRN/intermittently Type of Home: House Home Access: Stairs to enter   Entrance Stairs-Number of Steps: 2 Home Layout: One level Home Equipment: Walker - 2 wheels;Cane -  single point;Bedside commode;Grab bars - tub/shower;Grab bars - toilet      Prior Function Level of Independence: Needs assistance   Gait / Transfers Assistance Needed: Pt ambulating with RW vs SPC PRN 2/2 L  hip pain  ADL's / Homemaking Assistance Needed: Pt reports difficulty with LB ADL, often requires assist from family for LB dressing, difficulty with tub transfers, indep with med mgt, able to drive but hasn't recently 2/2 pain and waiting on new glasses to be delivered  Comments: Ambulatory with [facility] for household mobility; endorses fall in April (with L hip dislocation?), but no additional falls since. + driving.     Hand Dominance        Extremity/Trunk Assessment   Upper Extremity Assessment Upper Extremity Assessment: Overall WFL for tasks assessed (R shoulder elevation to shoulder-height (Endorses history of RTC repair))    Lower Extremity Assessment Lower Extremity Assessment: Overall WFL for tasks assessed (L hip grossly 4-/5, guarded due to pain; otherwise, LEs grossly 4+ to 5/5 throughout.  Endorses "grabbing" pain in L LE; occasionally burning/shooting from L hip.  LEs with some degree of give-way weakness during formal testing)       Communication   Communication: No difficulties  Cognition Arousal/Alertness: Awake/alert   Overall Cognitive Status: Within Functional Limits for tasks assessed                                        General Comments      Exercises Other Exercises Other Exercises: Educated in role of PT and progessive mobility; reviewed safety with transfers and gait, use of RW with all mobility.  Patient voiced understanding of all information.   Assessment/Plan    PT Assessment Patient needs continued PT services  PT Problem List Decreased strength;Decreased range of motion;Decreased balance;Decreased mobility;Decreased activity tolerance;Decreased knowledge of use of DME;Decreased safety awareness;Decreased knowledge of precautions;Pain       PT Treatment Interventions DME instruction;Gait training;Stair training;Functional mobility training;Therapeutic activities;Patient/family education;Balance training;Therapeutic exercise     PT Goals (Current goals can be found in the Care Plan section)  Acute Rehab PT Goals Patient Stated Goal: to get back home PT Goal Formulation: With patient Time For Goal Achievement: 04/13/21 Potential to Achieve Goals: Good    Frequency Min 2X/week   Barriers to discharge        Co-evaluation               AM-PAC PT "6 Clicks" Mobility  Outcome Measure Help needed turning from your back to your side while in a flat bed without using bedrails?: None Help needed moving from lying on your back to sitting on the side of a flat bed without using bedrails?: None Help needed moving to and from a bed to a chair (including a wheelchair)?: A Little Help needed standing up from a chair using your arms (e.g., wheelchair or bedside chair)?: A Little Help needed to walk in hospital room?: A Little Help needed climbing 3-5 steps with a railing? : A Little 6 Click Score: 20    End of Session Equipment Utilized During Treatment: Gait belt Activity Tolerance: Patient tolerated treatment well Patient left: in chair;with call bell/phone within reach;with chair alarm set Nurse Communication: Mobility status PT Visit Diagnosis: Muscle weakness (generalized) (M62.81);Difficulty in walking, not elsewhere classified (R26.2)    Time: 1040-1110 PT Time Calculation (min) (ACUTE ONLY): 30 min   Charges:  PT Evaluation $PT Eval Moderate Complexity: 1 Mod PT Treatments $Therapeutic Activity: 8-22 mins        Ebon Ketchum H. Owens Shark, PT, DPT, NCS 03/30/21, 10:53 PM [phone]

## 2021-03-30 NOTE — Progress Notes (Cosign Needed)
[facility]  Physical Therapy Certification  Patient Details  Name: Betty Ferguson MRN: [id number] Date of Birth: 07/29/1961 Medical Diagnosis: Active Problems:   TIA (transient ischemic attack)  Visit Diagnosis: PT Visit Diagnosis: Muscle weakness (generalized) (M62.81), Difficulty in walking, not elsewhere classified (R26.2) PT Problem: Decreased strength, Decreased range of motion, Decreased balance, Decreased mobility, Decreased activity tolerance, Decreased knowledge of use of DME, Decreased safety awareness, Decreased knowledge of precautions, Pain  Goals: Pt will go Supine/Side to Sit: with modified independence (for pulmonary hygiene and self-repositioning) Pt will Transfer Bed to Chair/Chair to Bed: with modified independence (LRAD, for access to seating surfaces in home environment) Pt will Ambulate: > 125 feet, with modified independence, with least restrictive assistive device (for household mobilization upon discharge) Pt will Go Up / Down Stairs: 3-5 stairs, with supervision, with rail(s) (for entry/exit of home upon discharge)  Duration: Services will be provided through the following date: 04/13/21  Frequency: Min 2X/week  Amount: one treatment session per day unless otherwise indicated.    Certification Start Date: 06/01/5620 Certification End Date: 04/13/21   PT Treatments/Interventions: DME instruction, Gait training, Stair training, Functional mobility training, Therapeutic activities, Patient/family education, Balance training, Therapeutic exercise  Storm Dulski H. Owens Shark, PT, DPT, NCS 03/30/21, 10:54 PM [phone]

## 2021-03-30 NOTE — TOC Progression Note (Signed)
Transition of Care [facility]) - Progression Note    Patient Details  Name: Leyton Brownlee MRN: [id number] Date of Birth: 1961-07-20  Transition of Care [facility]) CM/SW [place], RN Phone Number: 03/30/2021, 12:35 PM  Clinical Narrative:   Patient lives at home with significant other.  She states she has no concerns about returning home after discharge, and significant other can assist after discharge.  Patient does not currently have home health, and has [facility]  To date, [facility] and [facility] have declined, TOC will continue to search for [facility].  Patient has no concerns about getting medications or taking them as directed.  She states she is currently working with [facility] for transportation to appointments, and declined assistance from [facility] for this need at this time. TOC contact information given, TOC to follow to discharge.         Expected Discharge Plan and Services                                                 Social Determinants of Health (SDOH) Interventions    Readmission Risk Interventions No flowsheet data found.

## 2021-03-30 NOTE — Evaluation (Signed)
Speech Language Pathology Evaluation Patient Details Name: Betty Ferguson MRN: [id number] DOB: 1961/09/03 Today's Date: 03/30/2021 Time: 4098-1191 SLP Time Calculation (min) (ACUTE ONLY): 40 min  Problem List:  Patient Active Problem List   Diagnosis Date Noted   TIA (transient ischemic attack) 03/29/2021   Past Medical History:  Past Medical History:  Diagnosis Date   Cancer ([place])    pancreatic   Collagen vascular disease ([place])    Fibromyalgia    H/O liver cancer    Lupus ([place])    Rheumatoid arthritis ([place])    Past Surgical History:  Past Surgical History:  Procedure Laterality Date   BACK SURGERY     CHOLECYSTECTOMY     LIVER SURGERY     PANCREAS SURGERY     PANCREATECTOMY     ROTATOR CUFF REPAIR Right    TOTAL ABDOMINAL HYSTERECTOMY     HPI:  Pt is an 60 y.o. female with a PMHx of migraine headaches (last migraine that she recalls was in her 14's), pancreatic and liver cancer (s/p resection), lupus and RA (on methotrexate and Xeljanz), DM2, peripheral neuropathy and fibromyalgia who presented to the ED yesterday night after her left arm and face went numb. She also had a headache. At the time of presentation, her left arm symptoms were resolving at the time of presentation, but she had continued left facial numbness. She first noticed her symptoms at about 2045. She chewed 4 baby ASA prior to arrival. She endorsed right sided neck and shoulder pain in addition to the headache while in the ED. CBG was 96. A Code Stroke was called in the ED.  MRI: "The brain has a normal appearance without evidence of  malformation, atrophy, old or acute small or large vessel  infarction, mass lesion, hemorrhage, hydrocephalus or extra-axial  collection.".   Assessment / Plan / Recommendation Clinical Impression  Pt appears to present w/ mild Motor Speech deficits c/b mild Dysfluency in phrase/sentence and conversation level - this was inconsistent and not as apparent when pt slowed down and took  her time during conversation. Pt spoke softly and stated "this is the way I speak b/c others think I talk loudly" at home; pt also endorsed mild Dysfluency of speech premorbid. The Dysfluency impacted her verbal communication moreso at the conversation level but also noted when ordering her Lunch meal on the phone -- pt stated she was "nervous". When pt utilized strategies of slowing rate of speech and pausing to take a deep breath(to relax), pt's Dysfluency was not as pronounced. Practiced w/ pt looking for places in conversation to pause to relax and to replenish breath support to complete the sentence w/ good volume and ease. Discussed the importance of sitting fully upright to support diahpragmatic breathing and breath support for speech. Also, discussed the importance of relaxing during verbal engagement w/ others; lessening anxiety/stress including envrionmental distractions during conversation(closed the door to her room to lessen passerbyers from looking in).  No expressive or receptive aphasia noted, No cognitive deficits noted. OM exam was wfl for lingual/labial strength/ROM; slight "tingling" in Left face remains since admit. Discussed Fluency Strategies w/ pt; practice and handouts given. Recommend pt f/u w/ Outpatient skilled ST servcies for a formal Evaluation to continue working on strategies and to provide further education/support of communication skills in ADLs; education on reducing concerns of the Dysfluency. CM/NSG/MD updated. Pt agreed.    SLP Assessment  SLP Recommendation/Assessment: All further Speech Lanaguage Pathology  needs can be addressed in the next venue  of care SLP Visit Diagnosis: Dysarthria and anarthria (R47.1)    Follow Up Recommendations  Outpatient SLP (if desired)    Frequency and Duration  (n/a)   (n/a)      SLP Evaluation Cognition  Overall Cognitive Status: Within Functional Limits for tasks assessed Arousal/Alertness: Awake/alert Orientation Level:  Oriented to person;Oriented to place;Oriented to time;Oriented to situation Attention: Focused;Sustained Focused Attention: Appears intact Sustained Attention: Appears intact Awareness: Appears intact Problem Solving: Appears intact Executive Function: Reasoning;Organizing Reasoning: Appears intact Organizing: Appears intact Behaviors:  (n/a) Safety/Judgment: Appears intact Comments: in general conversation [employer] Scales of Cognitive Functioning: Purposeful/appropriate       Comprehension  Auditory Comprehension Overall Auditory Comprehension: Appears within functional limits for tasks assessed Yes/No Questions: Within Functional Limits Commands: Within Functional Limits Conversation: Complex Visual Recognition/Discrimination Discrimination: Not tested Reading Comprehension Reading Status: Within funtional limits    Expression Expression Primary Mode of Expression: Verbal Verbal Expression Overall Verbal Expression: Appears within functional limits for tasks assessed Initiation: No impairment Level of Generative/Spontaneous Verbalization: Conversation Repetition: No impairment Naming: No impairment Pragmatics:  (stated she "talked softly because people think I talk loudly" at home) Interfering Components:  (n/a) Non-Verbal Means of Communication: Not applicable Other Verbal Expression Comments: low volume Written Expression Dominant Hand: Right Written Expression: Not tested   Oral / Motor  Oral Motor/Sensory Function Overall Oral Motor/Sensory Function: Within functional limits (slight facial numbness but improved) Motor Speech Overall Motor Speech: Impaired (mild) Respiration: Within functional limits Phonation: Normal Resonance: Within functional limits Articulation: Within functional limitis Intelligibility: Intelligible Motor Planning: Impaired Level of Impairment: Phrase Motor Speech Errors: Aware Interfering Components: Premorbid status (but pt  feels slightly increased now) Effective Techniques: Slow rate;Increased vocal intensity;Over-articulate;Pause   GO                       Orinda Kenner, MS, CCC-SLP Speech Language Pathologist Rehab Services [phone] [facility] 03/30/2021, 4:51 PM

## 2021-03-30 NOTE — Consult Note (Signed)
Referring Physician: Dr. Kurtis Bushman    Chief Complaint: Left arm and facial numbness i  HPI: Betty Ferguson is an 60 y.o. female with a PMHx of migraine headaches (last migraine that she recalls was in her 80's), pancreatic and liver cancer (s/p resection), lupus and RA (on methotrexate and Xeljanz), DM2, peripheral neuropathy and fibromyalgia who presented to the ED yesterday night after her left arm and face went numb. She also had a headache. At the time of presentation, her left arm symptoms were resolving at the time of presentation, but she had continued left facial numbness. She first noticed her symptoms at about 2045. She chewed 4 baby ASA prior to arrival. She endorsed right sided neck and shoulder pain in addition to the headache while in the ED. CBG was 96. A Code Stroke was called in the ED.   CTA in the ED was negative for LVO, but atheromatous change at the origin of the left vertebral artery with moderate to severe stenosis was noted, in addition to mild for age atheromatous changes elsewhere. Migraine cocktail was administered in the ED. Plavix was added to her home ASA.   Today, she is complaining of a 10/10 stabbing headache that is present along the left side of her head and left occipital region. There is associated severe neck pain when patient tries to lift her head off the bed. She has been nauseated in association with the headache and also has experienced osmophobia. Sleeping has helped improve the headache pain somewhat. No blurred vision, facial droop or dysphasia. Initially complained of right limb weakness in the ED, but no longer feeling weak.   She states she has been taking ASA daily "for years".    Past Medical History:  Diagnosis Date   Cancer ([facility])    pancreatic   Collagen vascular disease ([facility] [place])    Fibromyalgia    H/O liver cancer    Lupus ([facility])    Rheumatoid arthritis ([place])     Past Surgical History:  Procedure Laterality Date   BACK SURGERY      CHOLECYSTECTOMY     LIVER SURGERY     PANCREAS SURGERY     PANCREATECTOMY     ROTATOR CUFF REPAIR Right    TOTAL ABDOMINAL HYSTERECTOMY      History reviewed. No pertinent family history. Social History:  reports that she quit smoking about 2 years ago. Her smoking use included cigarettes. She has never used smokeless tobacco. She reports that she does not drink alcohol and does not use drugs.  Allergies:  Allergies  Allergen Reactions   Toradol [Ketorolac Tromethamine] Itching   Doxycycline Hives   Morphine And Related     Medications: Prior to Admission:  Medications Prior to Admission  Medication Sig Dispense Refill Last Dose   aspirin 81 MG chewable tablet Chew by mouth daily.   03/29/2021   atorvastatin (LIPITOR) 40 MG tablet Take 40 mg by mouth daily.   03/29/2021   cetirizine (ZYRTEC) 10 MG tablet Take 10 mg by mouth daily.   03/29/2021   clonazePAM (KLONOPIN) 0.5 MG tablet Take 0.5 mg by mouth at bedtime as needed for anxiety.      docusate sodium (COLACE) 100 MG capsule Take 100 mg by mouth daily as needed for mild constipation.      ergocalciferol (VITAMIN D2) 1.25 MG (50000 UT) capsule Take 50,000 Units by mouth once a week.      estrogens, conjugated, (PREMARIN) 0.45 MG tablet Take 0.45 mg by mouth daily.  Take daily for 21 days then do not take for 7 days.      fluticasone (FLONASE) 50 MCG/ACT nasal spray Place 2 sprays into both nostrils daily.      folic acid (FOLVITE) 1 MG tablet Take 1 mg by mouth daily.      gabapentin (NEURONTIN) 600 MG tablet Take 600 mg by mouth 2 (two) times daily.      ibuprofen (ADVIL) 800 MG tablet Take 800 mg by mouth every 8 (eight) hours as needed.      metFORMIN (GLUCOPHAGE-XR) 500 MG 24 hr tablet Take 500 mg by mouth at bedtime.      methadone (DOLOPHINE) 10 MG tablet Take 10 mg by mouth every 8 (eight) hours as needed.      methotrexate (RHEUMATREX) 2.5 MG tablet Take 2.5 mg by mouth once a week. Caution:Chemotherapy. Protect from  light.Take 5 tablets by mouth in the morning and 5 tablets in the evening once per week      oxyCODONE (OXYCONTIN) 15 mg 12 hr tablet Take 15 mg by mouth every 12 (twelve) hours as needed.      predniSONE (DELTASONE) 2.5 MG tablet Take 2.5 mg by mouth daily with breakfast.      QUEtiapine (SEROQUEL) 100 MG tablet Take 100 mg by mouth at bedtime.      Tofacitinib Citrate ER 11 MG TB24 Take by mouth.      Scheduled:  clopidogrel  75 mg Oral Daily   enoxaparin (LOVENOX) injection  0.5 mg/kg Subcutaneous Q24H   LORazepam  0.5 mg Intravenous Once   sodium chloride flush  3 mL Intravenous Once    ROS: As per HPI.   Physical Examination: Blood pressure (!) 159/77, pulse 62, temperature 97.8 F (36.6 C), resp. rate 16, height 5\' 2"  (1.575 m), weight 78.9 kg, SpO2 100 %.  HEENT: [place]/AT Lungs: Respirations unlabored Ext: No edema  Neurologic Examination: Mental Status:  Alert, oriented, thought content appropriate.  Speech fluent without evidence of aphasia.  Able to follow all commands without difficulty. Cranial Nerves: II:  Temporal visual fields intact with no extinction to DSS. PERRL.   III,IV, VI: No ptosis. EOMI without nystagmus.  V,VII: Smile symmetric, facial temp sensation equal bilaterally VIII: Hearing intact to voice IX,X: No hypophonia XI: Symmetric XII: Midline tongue extension  Motor: Right : Upper extremity   4+/5   Left:     Upper extremity   4+/5  Lower extremity   4+/5    Lower extremity   4+/5 Giveway weakness and effort dependence is noted.  Sensory: Intermittent right sided extinction to DSS (arm and leg). Temp sensation decreased to LUE, normal to RUE and BLE. Deep Tendon Reflexes:  1+ bilateral brachioradialis, biceps and patellae Plantars: Downgoing bilaterally  Cerebellar: No ataxia with FNF bilaterally. Effortful affect during coordination testing with slowed movements.  Gait: Deferred   Results for orders placed or performed during the hospital  encounter of 03/29/21 (from the past 48 hour(s))  CBG monitoring, ED     Status: None   Collection Time: 03/29/21  9:35 PM  Result Value Ref Range   Glucose-Capillary 96 70 - 99 mg/dL    Comment: Glucose reference range applies only to samples taken after fasting for at least 8 hours.  Protime-INR     Status: None   Collection Time: 03/29/21 10:00 PM  Result Value Ref Range   Prothrombin Time 12.8 11.4 - 15.2 seconds   INR 1.0 0.8 - 1.2    Comment: (  NOTE) INR goal varies based on device and disease states. Performed at [facility], [address] [place], [place] [address]   APTT     Status: None   Collection Time: 03/29/21 10:00 PM  Result Value Ref Range   aPTT 27 24 - 36 seconds    Comment: Performed at [facility], [address] [place], [place] [address]  CBC     Status: None   Collection Time: 03/29/21 10:00 PM  Result Value Ref Range   WBC 6.3 4.0 - 10.5 K/uL   RBC 4.53 3.87 - 5.11 MIL/uL   Hemoglobin 14.0 12.0 - 15.0 g/dL   HCT 43.3 36.0 - 46.0 %   MCV 95.6 80.0 - 100.0 fL   MCH 30.9 26.0 - 34.0 pg   MCHC 32.3 30.0 - 36.0 g/dL   RDW 15.2 11.5 - 15.5 %   Platelets 164 150 - 400 K/uL   nRBC 0.0 0.0 - 0.2 %    Comment: Performed at [facility], [address] [place], [place] [address]  Differential     Status: None   Collection Time: 03/29/21 10:00 PM  Result Value Ref Range   Neutrophils Relative % 53 %   Neutro Abs 3.4 1.7 - 7.7 K/uL   Lymphocytes Relative 34 %   Lymphs Abs 2.1 0.7 - 4.0 K/uL   Monocytes Relative 10 %   Monocytes Absolute 0.6 0.1 - 1.0 K/uL   Eosinophils Relative 1 %   Eosinophils Absolute 0.1 0.0 - 0.5 K/uL   Basophils Relative 1 %   Basophils Absolute 0.0 0.0 - 0.1 K/uL   Immature Granulocytes 1 %   Abs Immature Granulocytes 0.04 0.00 - 0.07 K/uL    Comment: Performed at [facility], [address] [place], [place] [address]  Comprehensive metabolic panel     Status: Abnormal   Collection  Time: 03/29/21 10:00 PM  Result Value Ref Range   Sodium 136 135 - 145 mmol/L   Potassium 4.6 3.5 - 5.1 mmol/L    Comment: HEMOLYSIS AT THIS LEVEL MAY AFFECT RESULT   Chloride 106 98 - 111 mmol/L   CO2 23 22 - 32 mmol/L   Glucose, Bld 96 70 - 99 mg/dL    Comment: Glucose reference range applies only to samples taken after fasting for at least 8 hours.   BUN 20 6 - 20 mg/dL   Creatinine, Ser 0.93 0.44 - 1.00 mg/dL   Calcium 8.6 (L) 8.9 - 10.3 mg/dL   Total Protein 6.4 (L) 6.5 - 8.1 g/dL   Albumin 3.5 3.5 - 5.0 g/dL   AST 20 15 - 41 U/L   ALT 12 0 - 44 U/L   Alkaline Phosphatase 42 38 - 126 U/L   Total Bilirubin 0.5 0.3 - 1.2 mg/dL   GFR, Estimated >60 >60 mL/min    Comment: (NOTE) Calculated using the CKD-EPI Creatinine Equation (2021)    Anion gap 7 5 - 15    Comment: Performed at [facility], [place], [place] [address]  Troponin I (High Sensitivity)     Status: None   Collection Time: 03/29/21 10:00 PM  Result Value Ref Range   Troponin I (High Sensitivity) 4 <18 ng/L    Comment: (NOTE) Elevated high sensitivity troponin I (hsTnI) values and significant  changes across serial measurements may suggest ACS but many other  chronic and acute conditions are known to elevate hsTnI results.  Refer to the "Links"  section for chest pain algorithms and additional  guidance. Performed at [facility], [place], [place] [address]   SARS CORONAVIRUS 2 (TAT 6-24 HRS) Nasopharyngeal Nasopharyngeal Swab     Status: None   Collection Time: 03/29/21 11:57 PM   Specimen: Nasopharyngeal Swab  Result Value Ref Range   SARS Coronavirus 2 NEGATIVE NEGATIVE    Comment: (NOTE) SARS-CoV-2 target nucleic acids are NOT DETECTED.  The SARS-CoV-2 RNA is generally detectable in upper and lower respiratory specimens during the acute phase of infection. Negative results do not preclude SARS-CoV-2 infection, do not rule out co-infections with other  pathogens, and should not be used as the sole basis for treatment or other patient management decisions. Negative results must be combined with clinical observations, patient history, and epidemiological information. The expected result is Negative.  Fact Sheet for Patients: SugarRoll.be  Fact Sheet for Healthcare Providers: https://www.woods-mathews.com/  This test is not yet approved or cleared by the [place] FDA and  has been authorized for detection and/or diagnosis of SARS-CoV-2 by FDA under an Emergency Use Authorization (EUA). This EUA will remain  in effect (meaning this test can be used) for the duration of the COVID-19 declaration under Se ction 564(b)(1) of the Act, 21 U.S.C. section 360bbb-3(b)(1), unless the authorization is terminated or revoked sooner.  Performed at [facility], [place] [address], [place], [place] [address]   Lipid panel     Status: Abnormal   Collection Time: 03/30/21  2:32 AM  Result Value Ref Range   Cholesterol 172 0 - 200 mg/dL   Triglycerides 62 <150 mg/dL   HDL 57 >40 mg/dL   Total CHOL/HDL Ratio 3.0 RATIO   VLDL 12 0 - 40 mg/dL   LDL Cholesterol 103 (H) 0 - 99 mg/dL    Comment:        Total Cholesterol/HDL:CHD Risk Coronary Heart Disease Risk Table                     Men   Women  1/2 Average Risk   3.4   3.3  Average Risk       5.0   4.4  2 X Average Risk   9.6   7.1  3 X Average Risk  23.4   11.0        Use the calculated Patient Ratio above and the CHD Risk Table to determine the patient's CHD Risk.        ATP III CLASSIFICATION (LDL):  <100     mg/dL   Optimal  100-129  mg/dL   Near or Above                    Optimal  130-159  mg/dL   Borderline  160-189  mg/dL   High  >190     mg/dL   Very High Performed at [facility], [address] [place], [place] [address]   Basic metabolic panel     Status: Abnormal   Collection Time: 03/30/21  2:32 AM  Result Value  Ref Range   Sodium 136 135 - 145 mmol/L   Potassium 3.7 3.5 - 5.1 mmol/L   Chloride 108 98 - 111 mmol/L   CO2 23 22 - 32 mmol/L   Glucose, Bld 114 (H) 70 - 99 mg/dL    Comment: Glucose reference range applies only to samples taken after fasting for at least 8 hours.   BUN 18 6 -  20 mg/dL   Creatinine, Ser 0.74 0.44 - 1.00 mg/dL   Calcium 8.2 (L) 8.9 - 10.3 mg/dL   GFR, Estimated >60 >60 mL/min    Comment: (NOTE) Calculated using the CKD-EPI Creatinine Equation (2021)    Anion gap 5 5 - 15    Comment: Performed at [facility], [address] [place], [place] [address]  CBC     Status: None   Collection Time: 03/30/21  2:32 AM  Result Value Ref Range   WBC 5.4 4.0 - 10.5 K/uL   RBC 4.04 3.87 - 5.11 MIL/uL   Hemoglobin 12.4 12.0 - 15.0 g/dL   HCT 37.6 36.0 - 46.0 %   MCV 93.1 80.0 - 100.0 fL   MCH 30.7 26.0 - 34.0 pg   MCHC 33.0 30.0 - 36.0 g/dL   RDW 14.9 11.5 - 15.5 %   Platelets 150 150 - 400 K/uL   nRBC 0.0 0.0 - 0.2 %    Comment: Performed at [facility], [place], [place] [address]  Troponin I (High Sensitivity)     Status: None   Collection Time: 03/30/21  2:32 AM  Result Value Ref Range   Troponin I (High Sensitivity) 4 <18 ng/L    Comment: (NOTE) Elevated high sensitivity troponin I (hsTnI) values and significant  changes across serial measurements may suggest ACS but many other  chronic and acute conditions are known to elevate hsTnI results.  Refer to the "Links" section for chest pain algorithms and additional  guidance. Performed at [facility], [address] [place], [place] [address]    CT [facility] HEAD NECK W WO CM  Result Date: 03/29/2021 CLINICAL DATA:  Initial evaluation for acute stroke, left-sided numbness. EXAM: CT ANGIOGRAPHY HEAD AND NECK TECHNIQUE: Multidetector CT imaging of the head and neck was performed using the standard protocol during bolus administration of intravenous contrast. Multiplanar CT  image reconstructions and MIPs were obtained to evaluate the vascular anatomy. Carotid stenosis measurements (when applicable) are obtained utilizing NASCET criteria, using the distal internal carotid diameter as the denominator. CONTRAST:  159mL OMNIPAQUE IOHEXOL 350 MG/ML SOLN COMPARISON:  Prior CT from earlier the same day. FINDINGS: CTA NECK FINDINGS Aortic arch: Visualized aortic arch normal caliber with normal branch pattern. Scattered atheromatous change about the aortic arch and origin of the great vessels without hemodynamically significant stenosis. Right carotid system: Right CCA patent from its origin to the bifurcation without stenosis. Eccentric calcified plaque at the right carotid bulb/proximal right ICA without significant stenosis. Right ICA patent distally without stenosis, dissection or occlusion. Left carotid system: Left CCA patent from its origin to the bifurcation without stenosis. Scattered plaque about the left carotid bulb/proximal left ICA without significant stenosis. Left ICA patent distally without stenosis, dissection or occlusion. Vertebral arteries: Both vertebral arteries arise from the subclavian arteries. No significant proximal subclavian artery stenosis. Atheromatous change at the origin of the left vertebral artery with associated moderate to severe stenosis. Vertebral arteries otherwise patent distally without stenosis, dissection or occlusion. Skeleton: No visible acute osseous finding. No discrete or worrisome osseous lesions. Other neck: No other acute soft tissue abnormality within the neck. No mass or adenopathy. Upper chest: Emphysema. Visualized upper chest demonstrates no other acute finding. Review of the MIP images confirms the above findings CTA HEAD FINDINGS Anterior circulation: Petrous segments patent bilaterally. Atheromatous change within the carotid siphons without significant stenosis. A1 segments patent bilaterally. Normal anterior communicating artery  complex. Anterior cerebral arteries  patent to their distal aspects without stenosis. No M1 stenosis or occlusion. Normal MCA bifurcations. Distal MCA branches well perfused and symmetric. Posterior circulation: Both V4 segments patent to the vertebrobasilar junction without stenosis. Both PICA origins patent and normal. Basilar diffusely diminutive but widely patent to its distal aspect. Superior cerebellar arteries patent bilaterally fetal type origin of the PCAs bilaterally. Both PCAs well perfused to their distal aspects without stenosis. Venous sinuses: Grossly patent allowing for timing the contrast bolus. Anatomic variants: Fetal type origin of the PCAs with associated diminutive vertebrobasilar system. No aneurysm. Review of the MIP images confirms the above findings IMPRESSION: 1. Negative CTA for emergent large vessel occlusion. 2. Atheromatous change about the origin of the left vertebral artery with associated moderate to severe stenosis. 3. Additional mild for age atheromatous change elsewhere about the major arterial vasculature of the head and neck as above. No other hemodynamically significant or correctable stenosis. 4. Fetal type origin of the PCAs with associated diminutive vertebrobasilar system. 5. Aortic Atherosclerosis (ICD10-I70.0) and Emphysema (ICD10-J43.9). Electronically Signed   By: Jeannine Boga M.D.   On: 03/29/2021 23:36   CT ANGIO CHEST AORTA W/CM & OR WO/CM  Result Date: 03/29/2021 CLINICAL DATA:  Aortic disease. Shortness of breath. Left arm numbness. EXAM: CT ANGIOGRAPHY CHEST WITH CONTRAST TECHNIQUE: Multidetector CT imaging of the chest was performed using the standard protocol during bolus administration of intravenous contrast. Multiplanar CT image reconstructions and MIPs were obtained to evaluate the vascular anatomy. CONTRAST:  164mL OMNIPAQUE IOHEXOL 350 MG/ML SOLN COMPARISON:  None. FINDINGS: Cardiovascular: Heart is normal size. Aorta is normal caliber.  Scattered coronary artery and aortic calcifications. Study was not performed to optimally opacify the pulmonary arteries, but knows large or central pulmonary emboli noted. Mediastinum/Nodes: No mediastinal, hilar, or axillary adenopathy. Trachea and esophagus are unremarkable. Thyroid unremarkable. Lungs/Pleura: Mild centrilobular and paraseptal emphysema. No confluent opacities or effusions. Upper Abdomen: Imaging into the upper abdomen demonstrates no acute findings. Prior cholecystectomy. Dilated common bile duct measuring 14 mm, likely related to post cholecystectomy state. Musculoskeletal: Chest wall soft tissues are unremarkable. No acute bony abnormality. Review of the MIP images confirms the above findings. IMPRESSION: No evidence of aortic aneurysm or dissection. Scattered coronary artery calcifications. Aortic Atherosclerosis (ICD10-I70.0) and Emphysema (ICD10-J43.9). Electronically Signed   By: Rolm Baptise M.D.   On: 03/29/2021 23:21   CT HEAD CODE STROKE WO CONTRAST  Result Date: 03/29/2021 CLINICAL DATA:  Code stroke. Initial evaluation for acute left-sided numbness, facial numbness. EXAM: CT HEAD WITHOUT CONTRAST TECHNIQUE: Contiguous axial images were obtained from the base of the skull through the vertex without intravenous contrast. COMPARISON:  Prior CT from 02/11/2018. FINDINGS: Brain: Cerebral volume within normal limits for patient age. No evidence for acute intracranial hemorrhage. No findings to suggest acute large vessel territory infarct. No mass lesion, midline shift, or mass effect. Ventricles are normal in size without evidence for hydrocephalus. No extra-axial fluid collection identified. Vascular: No hyperdense vessel identified. Skull: Scalp soft tissues demonstrate no acute abnormality. Calvarium intact. Sinuses/Orbits: Globes and orbital soft tissues within normal limits. Visualized paranasal sinuses are clear. Trace right mastoid effusion noted. ASPECTS [facility] Stroke Program  Early CT Score) - Ganglionic level infarction (caudate, lentiform nuclei, internal capsule, insula, M1-M3 cortex): 7 - Supraganglionic infarction (M4-M6 cortex): 3 Total score (0-10 with 10 being normal): 10 IMPRESSION: 1. Negative head CT.  No acute intracranial abnormality. 2. ASPECTS is 10. Critical Value/emergent results were called by telephone at the time of interpretation  on 03/29/2021 at 10:02 pm to provider Merlyn Lot , who verbally acknowledged these results. Electronically Signed   By: Jeannine Boga M.D.   On: 03/29/2021 22:04    Assessment: 60 y.o. female presenting with severe left sided headache and left sided numbness 1. MRI brain is negative for acute infarction. No significant white matter disease is seen. No subarachnoid signal changes consistent with blood are seen.   2. CTA showed atherosclerotic changes in the intracranial and extracranial circulation. No aneurysm seen.  3. EKG: Sinus bradycardia 4. Although the patient has stroke risk factors, her MRI, which was negative while patient was still symptomatic with left sided numbess and tingling, militates against stroke as the etiology. Her sensory symptoms have switched sides from her right side while in the ED, to her left. There was also some giveway weakness on exam suggesting a functional component. Her current headache has several features consistent with migraine headache. Most likely etiology for her presentation is complicated migraine.  5. She has symptoms of neck pain with neck movement, but no meningismus. Afebrile with normal WBC and nondiaphoretic. No symptoms of AMS on presentation and no evidence for altered cognition on exam.   Recommendations: 1. PRN medication for headache control. Recommend 25 mg IV phenergan x 1, or Compazine 10 mg IV x 1.  2. Continue her home ASA. No clear indication for DAPT and Plavix can be discontinued, as bleeding risk most likely outweighs benefit given that MRI is negative for  stroke and symptoms are more consistent with complicated migraine.  3. If her estrogen replacement therapy is not strongly indicated from a medical standpoint, it may be safest to discontinue this medication as it increases the risk of stroke.  4. Outpatient Neurology follow up.   @Electronically  signed: Dr. Kerney Elbe  03/30/2021, 1:00 PM

## 2021-03-31 LAB — HEMOGLOBIN A1C
Hgb A1c MFr Bld: 6.4 % — ABNORMAL HIGH (ref 4.8–5.6)
Mean Plasma Glucose: 137 mg/dL

## 2021-07-05 IMAGING — CT CT ANGIO CHEST
3 of 6 series · 18 of 46 positions shown · IV contrast (APPLIED)
Comparison: None.

CLINICAL DATA: Aortic disease. Shortness of breath. Left arm
numbness.

EXAM:
CT ANGIOGRAPHY CHEST WITH CONTRAST
TECHNIQUE: Multidetector CT imaging of the chest was performed using the
standard protocol during bolus administration of intravenous
contrast. Multiplanar CT image reconstructions and MIPs were
obtained to evaluate the vascular anatomy.
CONTRAST:  125mL OMNIPAQUE IOHEXOL 350 MG/ML SOLN

[Series 4: axial arterial · axial · arterial · 0.66mm/px · z∈[-474,-252]mm · 11 of 90 slices shown]
[im 8/90  lung]
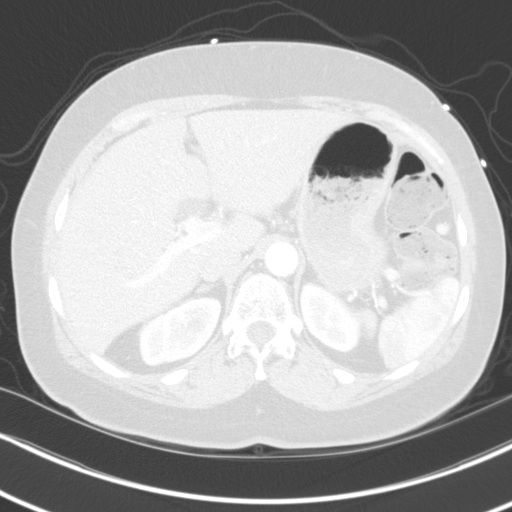
[im 15/90  soft-tissue]
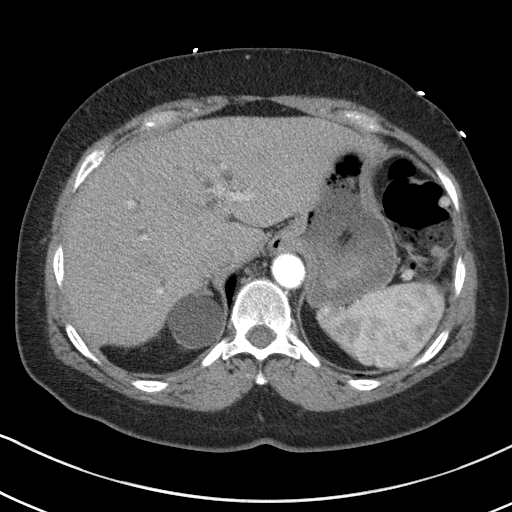
[im 23/90  lung]
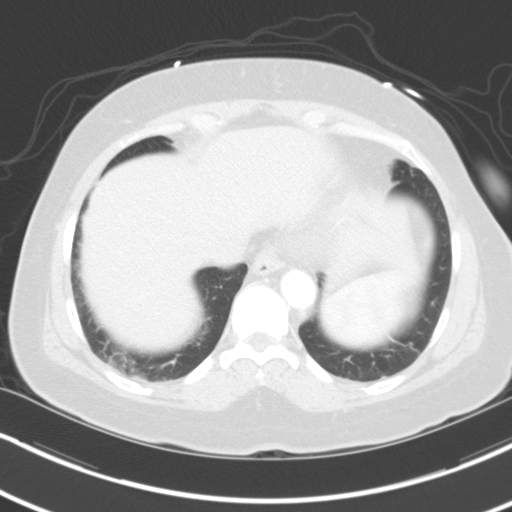
[im 30/90  soft-tissue]
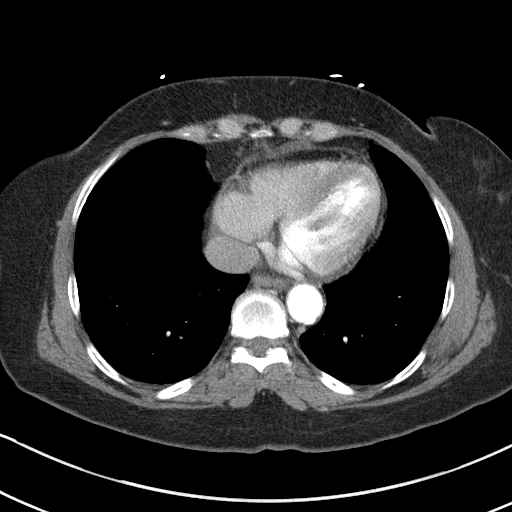
[im 38/90  lung]
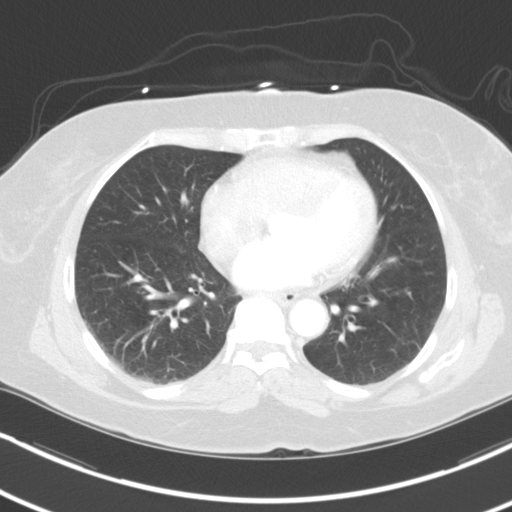
[im 45/90  soft-tissue]
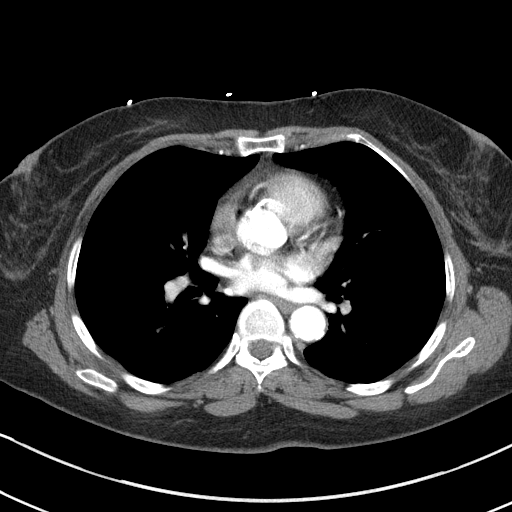
[im 52/90  lung]
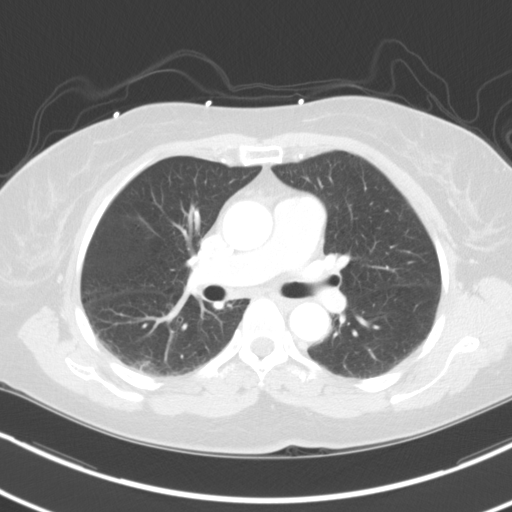
[im 60/90  soft-tissue]
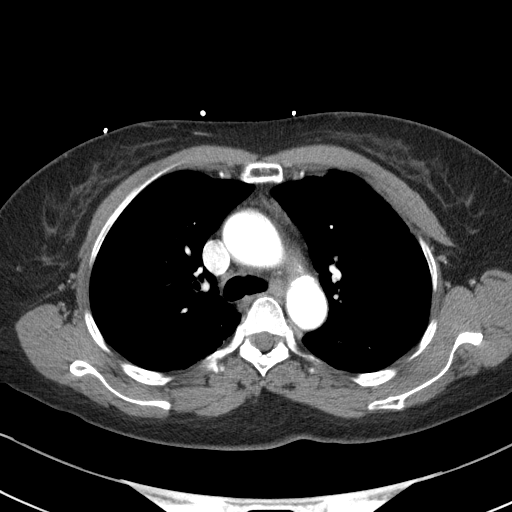
[im 67/90  lung]
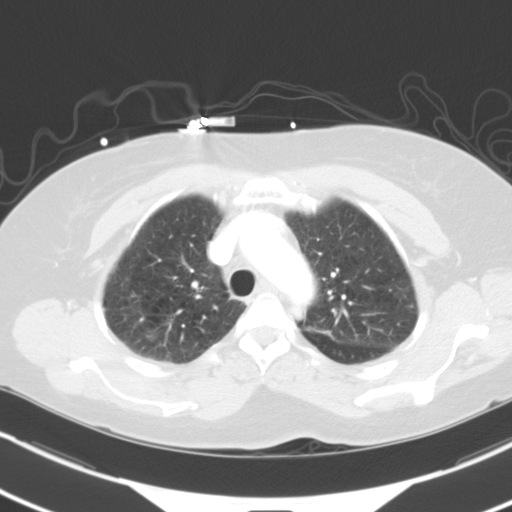
[im 75/90  soft-tissue]
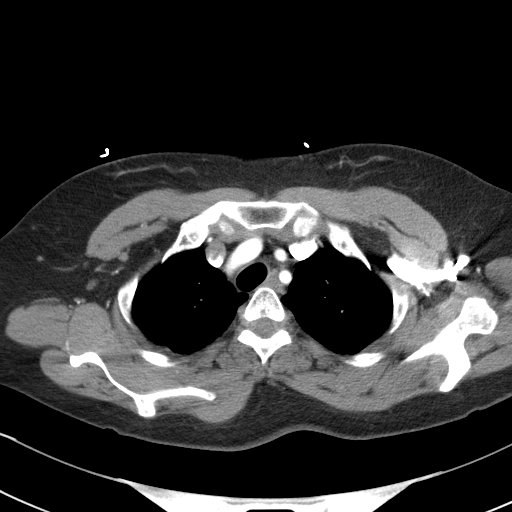
[im 82/90  lung]
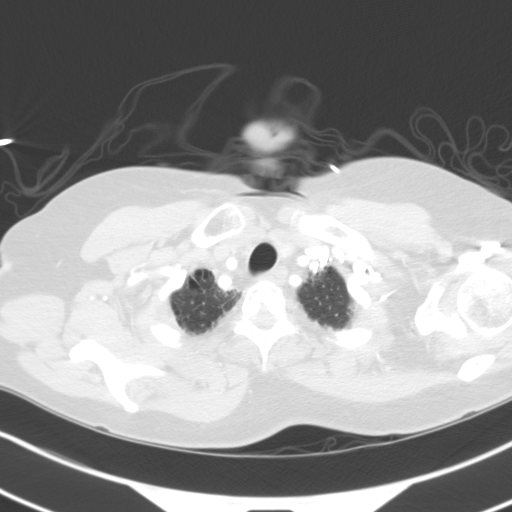

[Series 5: lung · axial · 0.66mm/px · z∈[-468,-378]mm · 4 of 135 slices shown]
[im 15/135  soft-tissue]
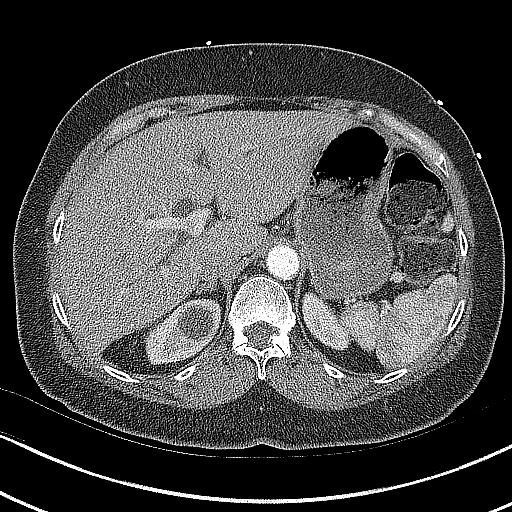
[im 30/135  soft-tissue]
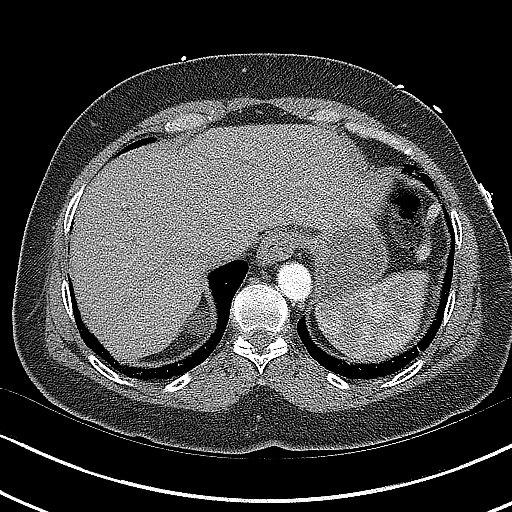
[im 45/135  soft-tissue]
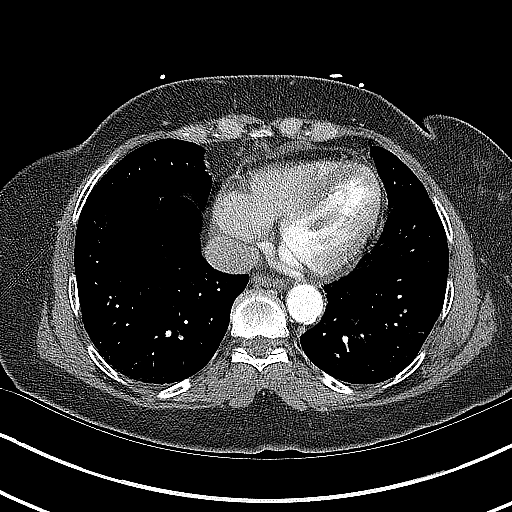
[im 60/135  soft-tissue]
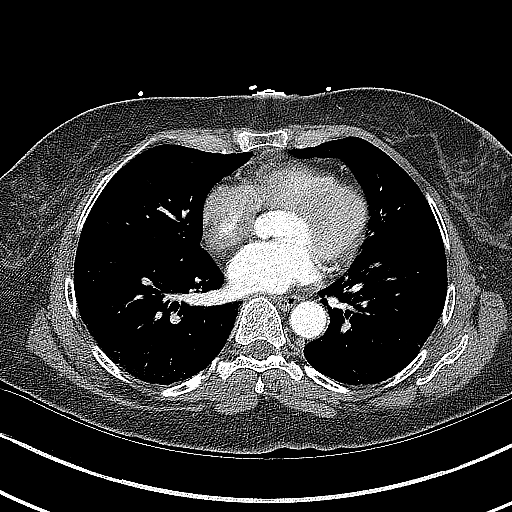

[Series 7: coronal · coronal · 0.54mm/px · 3 of 92 slices shown]
[im 23/92  soft-tissue]
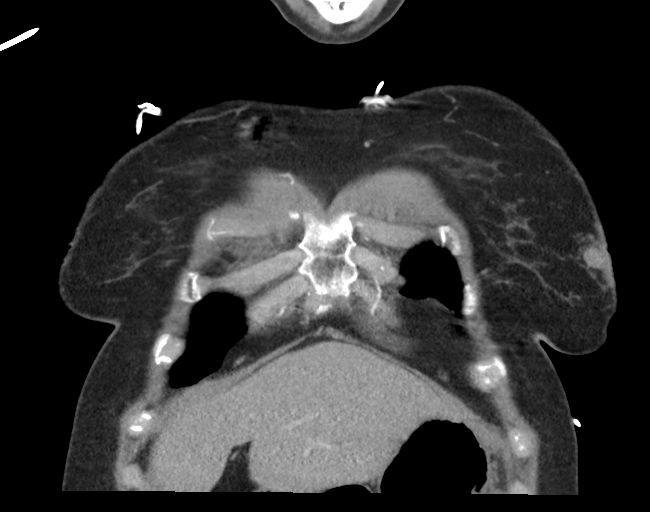
[im 46/92  soft-tissue]
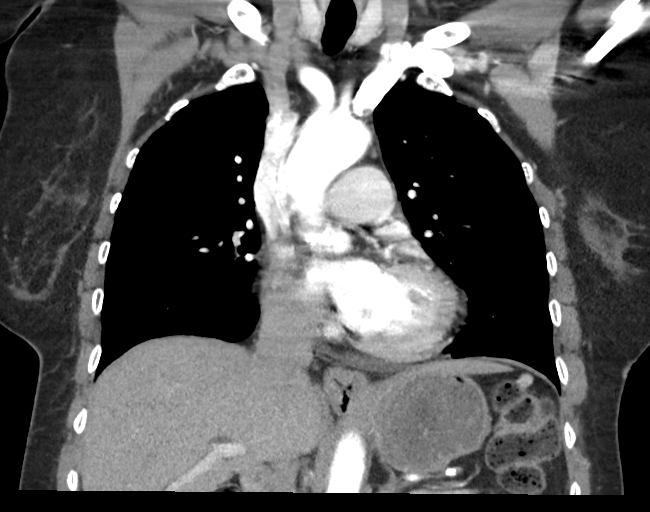
[im 69/92  soft-tissue]
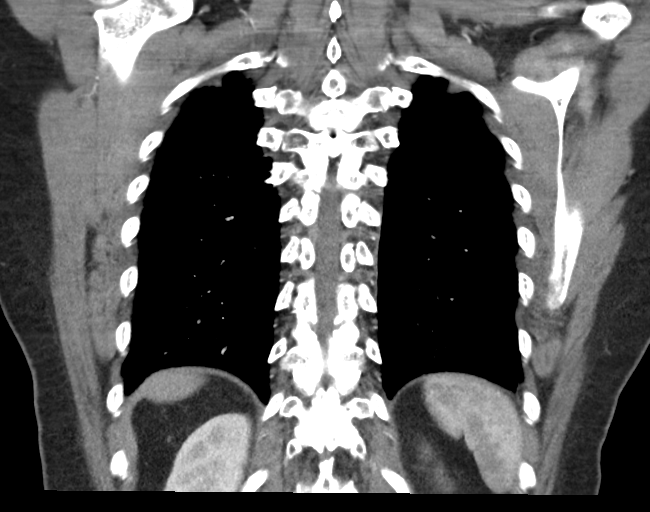

[18 of 46 positions shown; findings below may reference images not displayed]

FINDINGS: Cardiovascular: Heart is normal size. Aorta is normal caliber.
Scattered coronary artery and aortic calcifications. Study was not
performed to optimally opacify the pulmonary arteries, but knows
large or central pulmonary emboli noted.

Mediastinum/Nodes: No mediastinal, hilar, or axillary adenopathy.
Trachea and esophagus are unremarkable. Thyroid unremarkable.

Lungs/Pleura: Mild centrilobular and paraseptal emphysema. No
confluent opacities or effusions.

Upper Abdomen: Imaging into the upper abdomen demonstrates no acute
findings. Prior cholecystectomy. Dilated common bile duct measuring
14 mm, likely related to post cholecystectomy state.

Musculoskeletal: Chest wall soft tissues are unremarkable. No acute
bony abnormality.

Review of the MIP images confirms the above findings.
IMPRESSION: No evidence of aortic aneurysm or dissection.

Scattered coronary artery calcifications.

Aortic Atherosclerosis ([id number]) and Emphysema ([id number]).

## 2021-07-06 IMAGING — US US EXTREM LOW VENOUS*L*
1 series · 13 of 24 positions shown · non-contrast
Comparison: None.

CLINICAL DATA: Left leg pain and edema



[Series 1: us venous img lower uni left (dvt) · portal-venous · 13 of 34 slices shown]
[im 1/34]
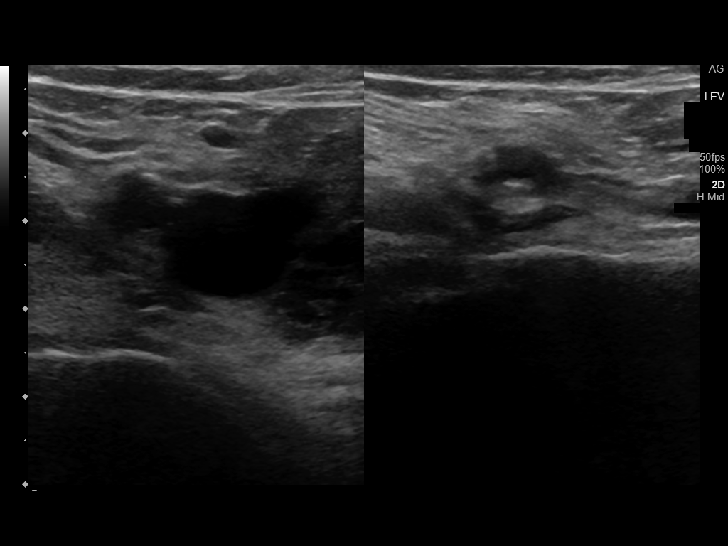
[im 3/34]
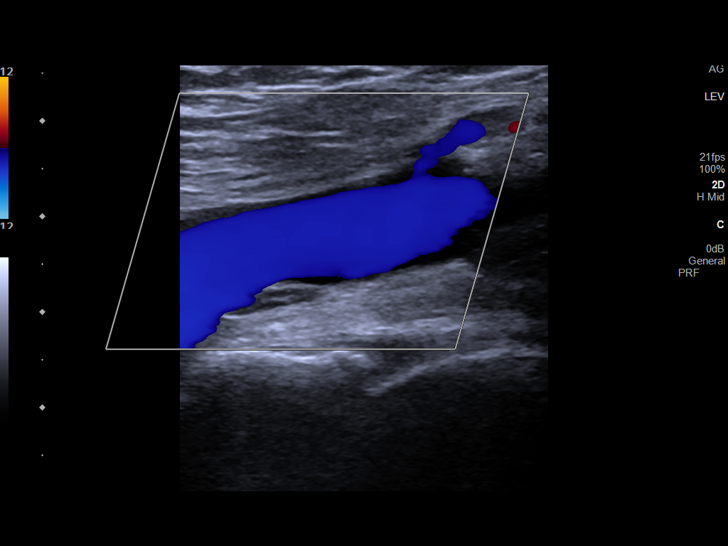
[im 6/34]
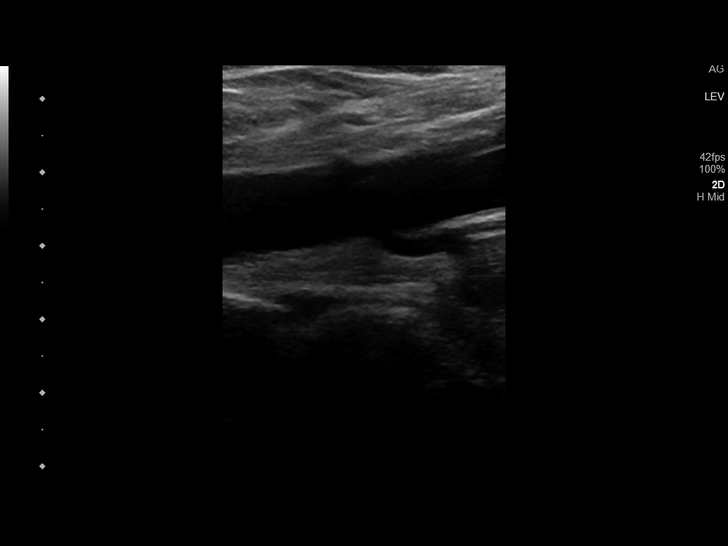
[im 9/34]
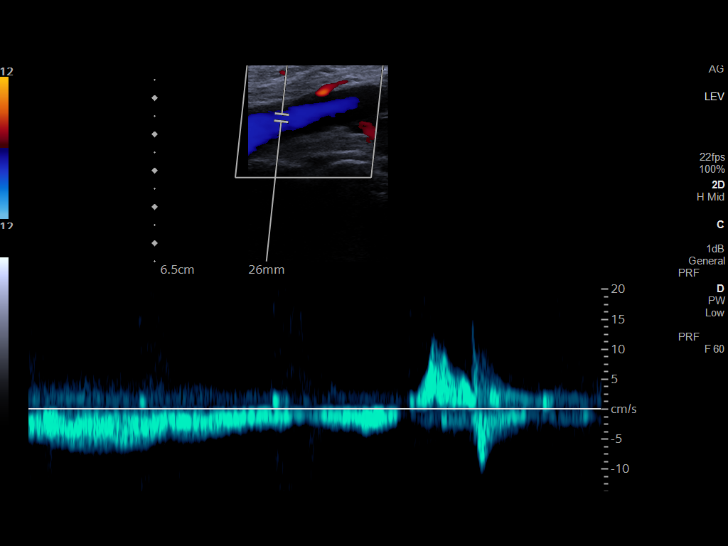
[im 12/34]
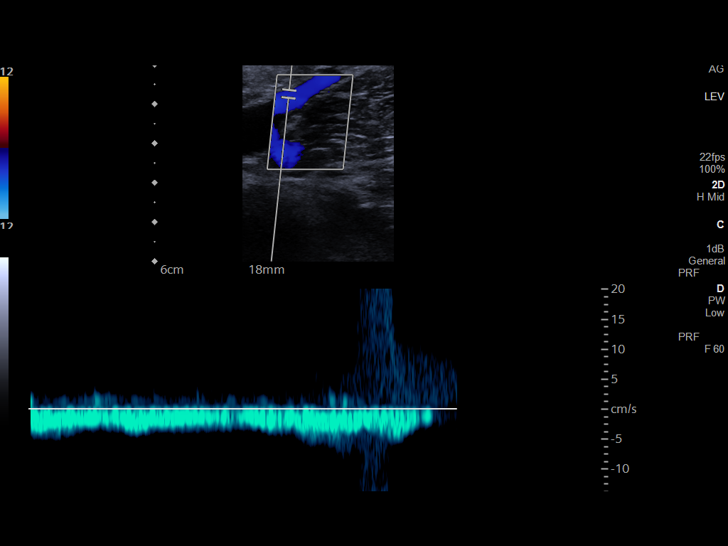
[im 15/34]
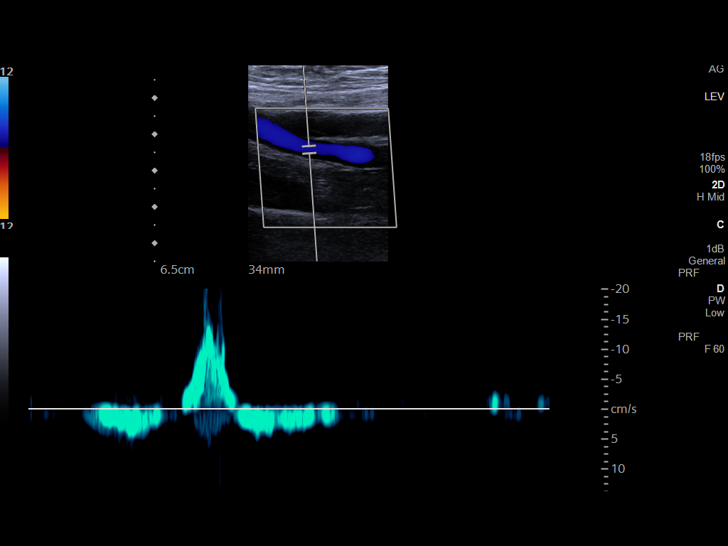
[im 18/34]
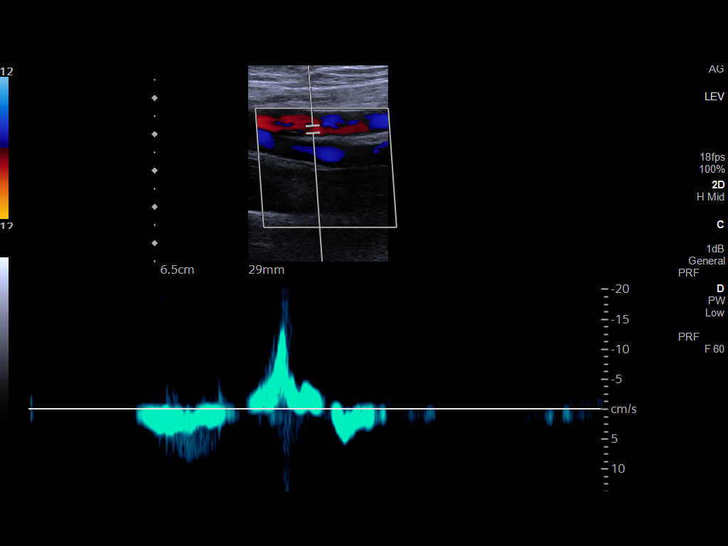
[im 19/34]
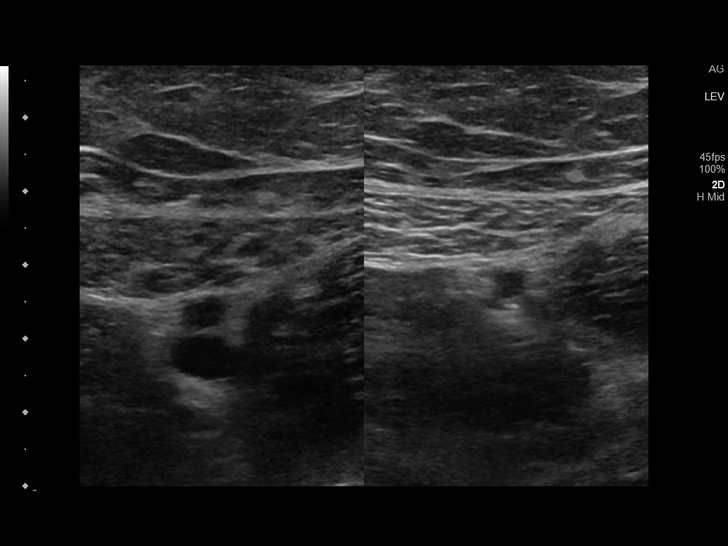
[im 22/34]
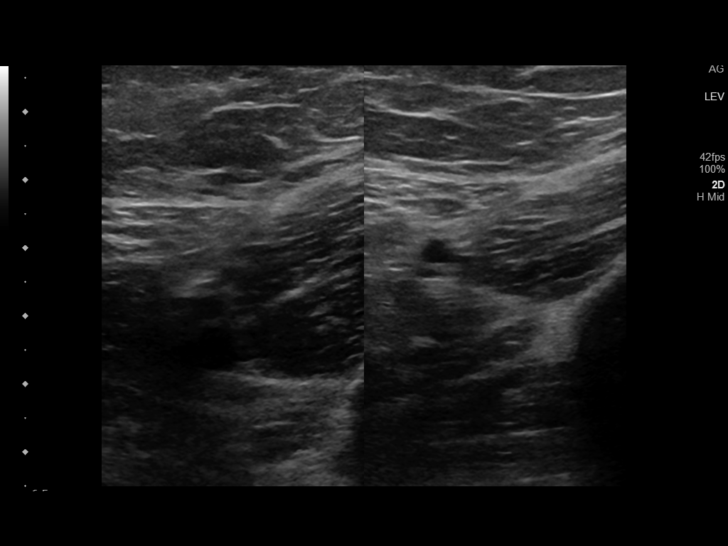
[im 25/34]
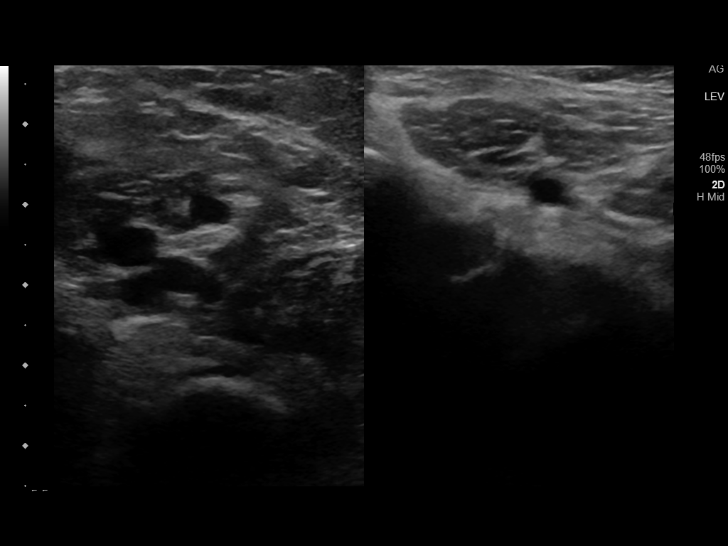
[im 28/34]
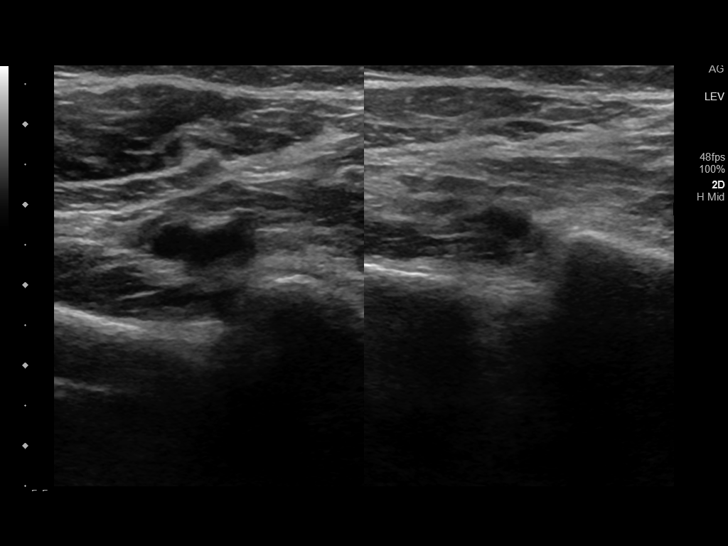
[im 31/34]
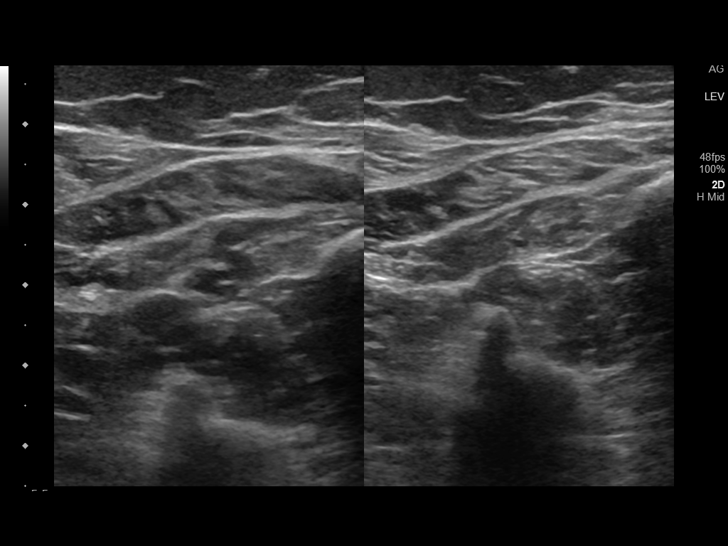
[im 34/34]
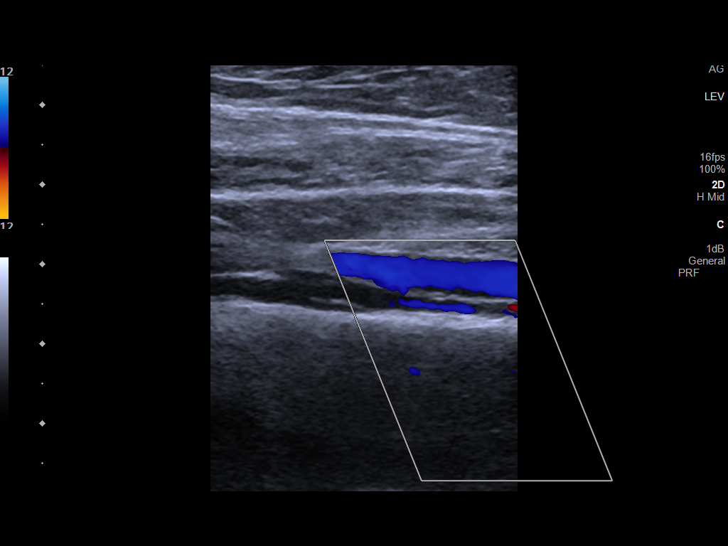

[13 of 24 positions shown; findings below may reference images not displayed]

FINDINGS: Contralateral Common Femoral Vein: Respiratory phasicity is normal
and symmetric with the symptomatic side. No evidence of thrombus.
Normal compressibility.

Common Femoral Vein: No evidence of thrombus. Normal
compressibility, respiratory phasicity and response to augmentation.

Saphenofemoral Junction: No evidence of thrombus. Normal
compressibility and flow on color Doppler imaging.

Profunda Femoral Vein: No evidence of thrombus. Normal
compressibility and flow on color Doppler imaging.

Femoral Vein: No evidence of thrombus. Normal compressibility,
respiratory phasicity and response to augmentation.

Popliteal Vein: No evidence of thrombus. Normal compressibility,
respiratory phasicity and response to augmentation.

Calf Veins: No evidence of thrombus. Normal compressibility and flow
on color Doppler imaging.
IMPRESSION: No evidence of deep venous thrombosis.

## 2022-03-27 ENCOUNTER — Emergency Department: Payer: Medicare Other

## 2022-03-27 ENCOUNTER — Emergency Department
Admission: EM | Admit: 2022-03-27 | Discharge: 2022-03-27 | Disposition: A | Discharge status: Home / Self Care | Payer: Medicare Other | Attending: Emergency Medicine | Admitting: Emergency Medicine

## 2022-03-27 ENCOUNTER — Other Ambulatory Visit: Payer: Self-pay

## 2022-03-27 DIAGNOSIS — M25511 Pain in right shoulder: Secondary | ICD-10-CM | POA: Insufficient documentation

## 2022-03-27 DIAGNOSIS — G4486 Cervicogenic headache: Secondary | ICD-10-CM | POA: Diagnosis not present

## 2022-03-27 DIAGNOSIS — R519 Headache, unspecified: Secondary | ICD-10-CM | POA: Diagnosis present

## 2022-03-27 LAB — COMPREHENSIVE METABOLIC PANEL
ALT: 22 U/L (ref 0–44)
AST: 26 U/L (ref 15–41)
Albumin: 3.7 g/dL (ref 3.5–5.0)
Alkaline Phosphatase: 57 U/L (ref 38–126)
Anion gap: 5 (ref 5–15)
BUN: 16 mg/dL (ref 8–23)
CO2: 25 mmol/L (ref 22–32)
Calcium: 9.4 mg/dL (ref 8.9–10.3)
Chloride: 108 mmol/L (ref 98–111)
Creatinine, Ser: 0.81 mg/dL (ref 0.44–1.00)
GFR, Estimated: >60 mL/min (ref >60)
Glucose, Bld: 98 mg/dL (ref 70–99)
Potassium: 4.3 mmol/L (ref 3.5–5.1)
Sodium: 138 mmol/L (ref 135–145)
Total Bilirubin: 0.7 mg/dL (ref 0.3–1.2)
Total Protein: 6.6 g/dL (ref 6.5–8.1)

## 2022-03-27 LAB — URINALYSIS, ROUTINE W REFLEX MICROSCOPIC
Bilirubin Urine: NEGATIVE
Glucose, UA: NEGATIVE mg/dL
Hgb urine dipstick: NEGATIVE
Ketones, ur: NEGATIVE mg/dL
Leukocytes,Ua: NEGATIVE
Nitrite: NEGATIVE
Protein, ur: NEGATIVE mg/dL
Specific Gravity, Urine: 1.014 (ref 1.005–1.030)
pH: 5 (ref 5.0–8.0)

## 2022-03-27 LAB — CBC
HCT: 44.8 % (ref 36.0–46.0)
Hemoglobin: 13.1 g/dL (ref 12.0–15.0)
MCH: 30.3 pg (ref 26.0–34.0)
MCHC: 29.2 g/dL — ABNORMAL LOW (ref 30.0–36.0)
MCV: 103.5 fL — ABNORMAL HIGH (ref 80.0–100.0)
Platelets: 157 10*3/uL (ref 150–400)
RBC: 4.33 MIL/uL (ref 3.87–5.11)
RDW: 15 % (ref 11.5–15.5)
WBC: 6.5 10*3/uL (ref 4.0–10.5)
nRBC: 0 % (ref 0.0–0.2)

## 2022-03-27 LAB — TROPONIN I (HIGH SENSITIVITY): Troponin I (High Sensitivity): 4 ng/L (ref <18)

## 2022-03-27 LAB — LIPASE, BLOOD: Lipase: 25 U/L (ref 11–51)

## 2022-03-27 MED ORDER — CYCLOBENZAPRINE HCL 10 MG PO TABS: 10.0000 mg | ORAL_TABLET | Freq: Three times a day (TID) | ORAL | 0 refills | Status: AC | PRN

## 2022-03-27 MED ORDER — PROCHLORPERAZINE MALEATE 5 MG PO TABS: 5.0000 mg | ORAL_TABLET | Freq: Four times a day (QID) | ORAL | 0 refills | Status: DC | PRN

## 2022-03-27 MED ORDER — IOHEXOL 350 MG/ML SOLN: 75.0000 mL | Freq: Once | INTRAVENOUS | Status: DC | PRN

## 2022-03-27 MED ORDER — PROCHLORPERAZINE EDISYLATE 10 MG/2ML IJ SOLN
10.0000 mg | Freq: Once | INTRAMUSCULAR | Status: AC
  Administered 2022-03-27: 10 mg via INTRAVENOUS
  Filled 2022-03-27: qty 2

## 2022-03-27 MED ORDER — OXYCODONE-ACETAMINOPHEN 5-325 MG PO TABS
2.0000 | ORAL_TABLET | Freq: Once | ORAL | Status: AC
  Administered 2022-03-27: 2 via ORAL
  Filled 2022-03-27: qty 2

## 2022-03-27 MED ORDER — DIPHENHYDRAMINE HCL 50 MG/ML IJ SOLN
25.0000 mg | Freq: Once | INTRAMUSCULAR | Status: AC
  Administered 2022-03-27: 25 mg via INTRAVENOUS
  Filled 2022-03-27: qty 1

## 2022-03-27 MED ORDER — ONDANSETRON HCL 4 MG/2ML IJ SOLN
4.0000 mg | Freq: Once | INTRAMUSCULAR | Status: AC
  Administered 2022-03-27: 4 mg via INTRAVENOUS
  Filled 2022-03-27: qty 2

## 2022-03-27 MED ORDER — HYDROMORPHONE HCL 1 MG/ML IJ SOLN
1.0000 mg | Freq: Once | INTRAMUSCULAR | Status: AC
  Administered 2022-03-27: 1 mg via INTRAVENOUS
  Filled 2022-03-27: qty 1

## 2022-03-27 NOTE — ED Triage Notes (Signed)
Pt to ED POV for nausea and slight dizziness since 1 hour ago. Is spitting up, no vomiting. States feels slightly SOB since 1 hour ago and has been wheezing. Denies CP Complains of headache, 10/10 and R shoulder pain from torn rotator cuff, has appt with surgeon on Tuesday. Pt is alert and oriented, unlabored respirations. EKG being performed. BP is 165/82.

## 2022-05-22 ENCOUNTER — Other Ambulatory Visit: Payer: Self-pay

## 2022-05-22 ENCOUNTER — Emergency Department
Admission: EM | Admit: 2022-05-22 | Discharge: 2022-05-22 | Disposition: A | Discharge status: Home / Self Care | Payer: Medicare Other | Attending: Emergency Medicine | Admitting: Emergency Medicine

## 2022-05-22 ENCOUNTER — Encounter: Payer: Self-pay | Admitting: Emergency Medicine

## 2022-05-22 DIAGNOSIS — E119 Type 2 diabetes mellitus without complications: Secondary | ICD-10-CM | POA: Diagnosis not present

## 2022-05-22 DIAGNOSIS — M25511 Pain in right shoulder: Secondary | ICD-10-CM | POA: Diagnosis present

## 2022-05-22 MED ORDER — HYDROMORPHONE HCL 1 MG/ML IJ SOLN
1.0000 mg | Freq: Once | INTRAMUSCULAR | Status: AC
  Administered 2022-05-22: 1 mg via INTRAVENOUS
  Filled 2022-05-22: qty 1

## 2022-05-22 MED ORDER — PROCHLORPERAZINE EDISYLATE 10 MG/2ML IJ SOLN
10.0000 mg | Freq: Once | INTRAMUSCULAR | Status: AC
  Administered 2022-05-22: 10 mg via INTRAVENOUS
  Filled 2022-05-22: qty 2

## 2022-05-22 NOTE — ED Provider Notes (Signed)
[facility] Provider Note    Event Date/Time   First MD Initiated Contact with Patient 05/22/22 1536     (approximate)   History   Shoulder Pain   HPI  Betty Ferguson is a 61 y.o. female with a past medical history of type 2 diabetes, fibromyalgia, chronic pain, complete tear of her right rotator cuff scheduled for surgical repair and reverse shoulder surgery on 06/10/2022 presents today for evaluation of right shoulder pain.  Patient reports that she had a history of a right shoulder rotator cuff repair in 2009.  She reports that approximately 7 months ago she had another pop in her shoulder while putting the trash out, which is steadily been worsening over the past 7 months.  She reports that she is unable to pick up objects due to her pain.  She has had multiple cortisone injections into her shoulder and has tried physical therapy in the past.  She takes 2 methadone per day and 4 Percocet per day as needed.  She had an MRI that demonstrates a full-thickness tear of the supraspinatus and infraspinatus.  Patient reports that she is tired of the pain and her medicines are working as effectively as they were before.  She denies new injury or new pain.  No headache, dizziness, chest pain, shortness of breath, or neck pain.  No numbness or tingling.  She reports that the medications that she received last time she was here were effective for her and this is what she is requesting today.  Patient has a pain management doctor who manages her pain medications.  Patient Active Problem List   Diagnosis Date Noted   TIA (transient ischemic attack) 03/29/2021          Physical Exam   Triage Vital Signs: ED Triage Vitals  Enc Vitals Group     BP 05/22/22 1531 (!) 150/86     Pulse Rate 05/22/22 1531 80     Resp 05/22/22 1531 20     Temp 05/22/22 1535 98.9 F (37.2 C)     Temp Source 05/22/22 1535 Oral     SpO2 05/22/22 1531 98 %     Weight 05/22/22 1530 180 lb (81.6  kg)     Height 05/22/22 1530 '5\' 2"'$  (1.575 m)     Head Circumference --      Peak Flow --      Pain Score 05/22/22 1530 10     Pain Loc --      Pain Edu? --      Excl. in [place]? --     Most recent vital signs: Vitals:   05/22/22 1531 05/22/22 1535  BP: (!) 150/86   Pulse: 80   Resp: 20   Temp:  98.9 F (37.2 C)  SpO2: 98%     Physical Exam Vitals and nursing note reviewed.  Constitutional:      General: Awake and alert. No acute distress.    Appearance: Normal appearance. The patient is overweight.  HENT:     Head: Normocephalic and atraumatic.     Mouth: Mucous membranes are moist.  Eyes:     General: PERRL. Normal EOMs        Right eye: No discharge.        Left eye: No discharge.     Conjunctiva/sclera: Conjunctivae normal.  Cardiovascular:     Rate and Rhythm: Normal rate and regular rhythm.     Pulses: Normal pulses.  Pulmonary:  Effort: Pulmonary effort is normal. No respiratory distress.  Abdominal:     Abdomen is soft. There is no abdominal tenderness. Musculoskeletal:        General: No swelling. Normal range of motion.     Cervical back: Normal range of motion and neck supple.  Right shoulder: In a sling.  Pain with any attempted range of motion.  No tenderness palpation along the humerus, elbow, forearm, wrist.  Normal intrinsic muscle function of the hand.  Normal radial pulse. Skin:    General: Skin is warm and dry.     Capillary Refill: Capillary refill takes less than 2 seconds.     Findings: No rash.  Neurological:     Mental Status: The patient is awake and alert.      ED Results / Procedures / Treatments   Labs (all labs ordered are listed, but only abnormal results are displayed) Labs Reviewed - No data to display   EKG     RADIOLOGY MRI shoulder IMPRESSION 03/20/22:     1.  Status post rotator cuff repair with new full-thickness tears of the  supraspinatus and infraspinatus, with predominantly supraspinatus tendon  retraction and  progressive associated muscle atrophy.  2.  Severe subscapularis tendinosis with distal interstitial tearing.  3.  Severe long head biceps tendinosis with interstitial tearing and  findings suggestive of instability.  4.  Probable component of adhesive capsulitis.  5.  Nonspecific glenohumeral joint synovitis    PROCEDURES:  Critical Care performed:   Procedures   MEDICATIONS ORDERED IN ED: Medications  HYDROmorphone (DILAUDID) injection 1 mg (1 mg Intravenous Given 05/22/22 1638)  prochlorperazine (COMPAZINE) injection 10 mg (10 mg Intravenous Given 05/22/22 1639)     IMPRESSION / MDM / ASSESSMENT AND PLAN / ED COURSE  I reviewed the triage vital signs and the nursing notes.   Differential diagnosis includes, but is not limited to, acute on chronic pain, worsening tear.  No cervical spine tenderness, negative Spurling and Lhermitte sign, do not suspect cervical etiology.  She still has normal radial pulse, normal intrinsic muscle function of her hand.  She feels that her pain is exactly the same as the pain that she has been having and for which she is having surgery in a couple of weeks.  I discussed with her that we will treat her pain while she is in the hospital but I cannot send her home on oral narcotics given that she has a pain management agreement.  Patient understands and agrees.  Upon reevaluation she feels significantly improved and ready for discharge.  She is discharged in the care of her 2 family members.  She understands return precautions.   Patient's presentation is most consistent with severe exacerbation of chronic illness.   Clinical Course as of 05/22/22 1836  Sat May 22, 2022  1721 Patient reports pain is improved significantly and she is ready for discharge [JP]    Clinical Course User Index [JP] Alyvia Derk, Clarnce Flock, PA-C     FINAL CLINICAL IMPRESSION(S) / ED DIAGNOSES   Final diagnoses:  Acute pain of right shoulder     Rx / DC Orders   ED  Discharge Orders     None        Note:  This document was prepared using Dragon voice recognition software and may include unintentional dictation errors.   Emeline Gins 05/22/22 1836    Nena Polio, MD 05/22/22 [phone]

## 2022-05-22 NOTE — ED Triage Notes (Signed)
Pt reports has an injured rotator cuff in her right shoulder, is scheduled for surgery on 9/2 but needs some pain control.

## 2022-05-22 NOTE — Discharge Instructions (Signed)
Please follow-up with your orthopedist for your surgery as you have scheduled.  Please return for any new, worsening, or change in symptoms or other concerns, or pain different than your normal pain.  It was a pleasure caring for you today.

## 2022-06-05 ENCOUNTER — Emergency Department
Admission: EM | Admit: 2022-06-05 | Discharge: 2022-06-05 | Disposition: A | Discharge status: Home / Self Care | Payer: Medicare Other | Attending: Emergency Medicine | Admitting: Emergency Medicine

## 2022-06-05 ENCOUNTER — Encounter: Payer: Self-pay | Admitting: Emergency Medicine

## 2022-06-05 ENCOUNTER — Other Ambulatory Visit: Payer: Self-pay

## 2022-06-05 DIAGNOSIS — M25511 Pain in right shoulder: Secondary | ICD-10-CM

## 2022-06-05 DIAGNOSIS — R110 Nausea: Secondary | ICD-10-CM | POA: Insufficient documentation

## 2022-06-05 MED ORDER — PROCHLORPERAZINE MALEATE 10 MG PO TABS
10.0000 mg | ORAL_TABLET | Freq: Once | ORAL | Status: AC
  Administered 2022-06-05: 10 mg via ORAL
  Filled 2022-06-05: qty 1

## 2022-06-05 MED ORDER — FENTANYL CITRATE PF 50 MCG/ML IJ SOSY: 50.0000 ug | PREFILLED_SYRINGE | Freq: Once | INTRAMUSCULAR | Status: DC

## 2022-06-05 MED ORDER — FENTANYL CITRATE PF 50 MCG/ML IJ SOSY
50.0000 ug | PREFILLED_SYRINGE | Freq: Once | INTRAMUSCULAR | Status: AC
  Administered 2022-06-05: 50 ug via INTRAVENOUS
  Filled 2022-06-05: qty 1

## 2022-06-05 MED ORDER — HYDROMORPHONE HCL 1 MG/ML IJ SOLN
1.0000 mg | Freq: Once | INTRAMUSCULAR | Status: AC
  Administered 2022-06-05: 1 mg via INTRAVENOUS
  Filled 2022-06-05: qty 1

## 2022-06-05 MED ORDER — ONDANSETRON HCL 4 MG/2ML IJ SOLN
4.0000 mg | Freq: Once | INTRAMUSCULAR | Status: AC
  Administered 2022-06-05: 4 mg via INTRAVENOUS
  Filled 2022-06-05: qty 2

## 2022-06-05 NOTE — ED Triage Notes (Signed)
Pt reports is scheduled to have a shoulder replacement on Thursday but the pain is unbearable. Pt reports was seen here a couple of weeks ago for the same pain control. Pt reports just needs to make it through to her surgery.

## 2022-06-05 NOTE — ED Provider Notes (Signed)
[facility] Provider Note    Event Date/Time   First MD Initiated Contact with Patient 06/05/22 1728     (approximate)   History   Chief Complaint Shoulder Pain   HPI Betty Ferguson is a 61 y.o. female, history of fibromyalgia, lupus, rheumatoid arthritis, presents emergency department for evaluation of right-sided shoulder pain.  MRI on 03/20/2022 shows full-thickness tears of the supraspinatus and infraspinatus, as well as severe long head biceps tendinosis.  She has a shoulder surgery scheduled for Thursday, however she states that her pain is unbearable at this time.  She is currently taking her Percocet as needed, however she states that it is not enough.  She states that the pain is so severe that is making her nauseous as well.  She was here on 05/22/2022 with similar complaint, which required breakthrough pain relief with hydromorphone and Compazine.  She is unable to have any additional pain medications added to her regimen at home though due to her agreement with pain clinic.  Currently denies fever/chills, body aches, chest pain, shortness of breath, abdominal pain, nausea/vomiting, diarrhea, numbness/tingling in upper extremity, or dizziness/lightheadedness.  History Limitations: No limitations.        Physical Exam  Triage Vital Signs: ED Triage Vitals  Enc Vitals Group     BP 06/05/22 1732 (!) 140/76     Pulse Rate 06/05/22 1732 72     Resp 06/05/22 1732 16     Temp 06/05/22 1732 97.9 F (36.6 C)     Temp Source 06/05/22 1732 Oral     SpO2 06/05/22 1732 96 %     Weight 06/05/22 1701 178 lb 9.2 oz (81 kg)     Height 06/05/22 1701 '5\' 2"'$  (1.575 m)     Head Circumference --      Peak Flow --      Pain Score 06/05/22 1700 0     Pain Loc --      Pain Edu? --      Excl. in [place]? --     Most recent vital signs: Vitals:   06/05/22 2020 06/05/22 2146  BP: (!) 163/81 (!) 173/78  Pulse: 65 (!) 58  Resp: 16 18  Temp:    SpO2: 97% 99%     General: Awake, appears very uncomfortable and in pain. Skin: Warm, dry. No rashes or lesions.  Eyes: PERRL. Conjunctivae normal.  CV: Good peripheral perfusion.  Resp: Normal effort.  Abd: Soft, non-tender. No distention.  Neuro: At baseline. No gross neurological deficits.   Focused Exam: Right arm is in a sling.  PMS intact distally.  Limited range of motion of the shoulder due to significant pain in shoulder joint.  Patient states that this is her baseline.  No overlying erythema, swelling, or tenderness along the skin to suggest infectious pathology.  Physical Exam    ED Results / Procedures / Treatments  Labs (all labs ordered are listed, but only abnormal results are displayed) Labs Reviewed - No data to display   EKG N/A.   RADIOLOGY  ED Provider Interpretation: N/A.  No results found.  PROCEDURES:  Critical Care performed: N/A.  Procedures    MEDICATIONS ORDERED IN ED: Medications  prochlorperazine (COMPAZINE) tablet 10 mg (10 mg Oral Given 06/05/22 1933)  fentaNYL (SUBLIMAZE) injection 50 mcg (50 mcg Intravenous Given 06/05/22 1933)  HYDROmorphone (DILAUDID) injection 1 mg (1 mg Intravenous Given 06/05/22 2020)  ondansetron (ZOFRAN) injection 4 mg (4 mg Intravenous Given 06/05/22 2043)  HYDROmorphone (DILAUDID) injection 1 mg (1 mg Intravenous Given 06/05/22 2147)     IMPRESSION / MDM / ASSESSMENT AND PLAN / ED COURSE  I reviewed the triage vital signs and the nursing notes.                              Differential diagnosis includes, but is not limited to, rotator cuff injury, adhesive capsulitis, biceps tendinosis, subscapularis tendinosis  Assessment/Plan Patient presents for breakthrough pain relief in regards to underlying severe shoulder injury.  No signs of any infectious pathology.  She has a surgery scheduled for this Thursday and is requesting some pain relief until then.  Unable to discharge her with any narcotic medications at home due to her  agreement with pain clinic, however we will provide her with breakthrough pain relief and nausea management here as requested.  Treated her with a few rounds of fentanyl/hydromorphone with moderate success. She states that she feels much better and is ready to go home. Encouraged her to follow-up with her pain clinic as needed and to be sure to make it to her appointment for surgery this week.  Will discharge.  Provided the patient with anticipatory guidance, return precautions, and educational material. Encouraged the patient to return to the emergency department at any time if they begin to experience any new or worsening symptoms. Patient expressed understanding and agreed with the plan.   Patient's presentation is most consistent with acute, uncomplicated illness.       FINAL CLINICAL IMPRESSION(S) / ED DIAGNOSES   Final diagnoses:  Acute pain of right shoulder     Rx / DC Orders   ED Discharge Orders     None        Note:  This document was prepared using Dragon voice recognition software and may include unintentional dictation errors.   Teodoro Spray, [place] 06/05/22 2214    Lucillie Garfinkel, MD 06/06/22 [phone]

## 2022-06-05 NOTE — Discharge Instructions (Addendum)
-  Continue to take your pain medications at home as needed.  -Please be sure to make it to your appointment for surgery on Thursday.  -Return to the emergency department anytime if you begin to experience any new or worsening symptoms.

## 2022-07-10 ENCOUNTER — Emergency Department: Payer: Medicare Other

## 2022-07-10 ENCOUNTER — Emergency Department
Admission: EM | Admit: 2022-07-10 | Discharge: 2022-07-10 | Disposition: A | Discharge status: Home / Self Care | Payer: Medicare Other | Attending: Emergency Medicine | Admitting: Emergency Medicine

## 2022-07-10 ENCOUNTER — Other Ambulatory Visit: Payer: Self-pay

## 2022-07-10 DIAGNOSIS — M25511 Pain in right shoulder: Secondary | ICD-10-CM

## 2022-07-10 DIAGNOSIS — Z96611 Presence of right artificial shoulder joint: Secondary | ICD-10-CM | POA: Insufficient documentation

## 2022-07-10 DIAGNOSIS — G8918 Other acute postprocedural pain: Secondary | ICD-10-CM | POA: Diagnosis not present

## 2022-07-10 MED ORDER — FENTANYL CITRATE PF 50 MCG/ML IJ SOSY
50.0000 ug | PREFILLED_SYRINGE | Freq: Once | INTRAMUSCULAR | Status: AC
  Administered 2022-07-10: 50 ug via INTRAMUSCULAR
  Filled 2022-07-10: qty 1

## 2022-07-10 MED ORDER — ONDANSETRON 4 MG PO TBDP
4.0000 mg | ORAL_TABLET | Freq: Once | ORAL | Status: AC
  Administered 2022-07-10: 4 mg via ORAL
  Filled 2022-07-10: qty 1

## 2022-07-10 NOTE — ED Triage Notes (Signed)
Pt to ED via POV from home. Pt reports she had total right shoulder surgery x1 month ago. Pt reports right shoulder pain was severe this morning and has not been relieved. Pt reports numbness in right hand that has been present since surgery.

## 2022-07-10 NOTE — ED Provider Notes (Signed)
[facility] Provider Note  Patient Contact: 4:18 PM (approximate)   History   Shoulder Pain and Post-op Problem   HPI  Betty Ferguson is a 61 y.o. 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  Patient states that she is not interested in receiving a prescription for pain medicine but would just like pain medication in the emergency department as she has not been able to control her discomfort at home with Percocet.  She states that she has a follow-up appointment with her orthopedist at the end of this month.  No fever or chills.      Physical Exam   Triage Vital Signs: ED Triage Vitals  Enc Vitals Group     BP 07/10/22 1328 (!) 164/87     Pulse Rate 07/10/22 1328 73     Resp 07/10/22 1328 18     Temp --      Temp src --      SpO2 07/10/22 1328 98 %     Weight 07/10/22 1329 180 lb (81.6 kg)     Height 07/10/22 1329 5\' 2"  (1.575 m)     Head Circumference --      Peak Flow --      Pain Score 07/10/22 1329 10     Pain Loc --      Pain Edu? --      Excl. in [place]? --     Most recent vital signs: Vitals:   07/10/22 1328  BP: (!) 164/87  Pulse: 73  Resp: 18  SpO2: 98%     General: Alert and in no acute distress. Eyes:  PERRL. EOMI. Head: No acute traumatic findings ENT:      Nose: No congestion/rhinnorhea.      Mouth/Throat: Mucous membranes are moist. Neck: No stridor. No cervical spine tenderness to palpation. Cardiovascular:  Good peripheral perfusion Respiratory: Normal respiratory effort without tachypnea or retractions. Lungs CTAB. Good air entry to the bases with no decreased or absent breath sounds. Gastrointestinal: Bowel sounds 4 quadrants. Soft and nontender to palpation. No guarding or rigidity. No palpable masses. No distention. No CVA tenderness. Musculoskeletal: Patient performs  limited range of motion at the right shoulder.  Palpable radial and ulnar pulses bilaterally and symmetrically.  Capillary refill less than 2 seconds on the right. Neurologic:  No gross focal neurologic deficits are appreciated.  Skin:   No rash noted Other:   ED Results / Procedures / Treatments   Labs (all labs ordered are listed, but only abnormal results are displayed) Labs Reviewed - No data to display    RADIOLOGY  I personally viewed and evaluated these images as part of my medical decision making, as well as reviewing the written report by the radiologist.  ED Provider Interpretation: No acute bony abnormality   PROCEDURES:  Critical Care performed: No  Procedures   MEDICATIONS ORDERED IN ED: Medications  fentaNYL (SUBLIMAZE) injection 50 mcg (50 mcg Intramuscular Given 07/10/22 1608)  ondansetron (ZOFRAN-ODT) disintegrating tablet 4 mg (4 mg Oral Given 07/10/22 1608)     IMPRESSION / MDM / ASSESSMENT AND PLAN / ED COURSE  I reviewed the triage vital signs and the nursing notes.                              Assessment and plan Shoulder pain  61 year old female presents to the emergency department with acute right shoulder pain.  No acute bony abnormality was visualized on x-ray.  Patient was given injection of fentanyl in the emergency department to control her pain.  Patient was hyper tensive at triage but vital signs otherwise reassuring.  Patient felt overall improved after.  Recommended follow-up with her orthopedist.  All patient questions were answered.   FINAL CLINICAL IMPRESSION(S) / ED DIAGNOSES   Final diagnoses:  Acute pain of right shoulder     Rx / DC Orders   ED Discharge Orders     None        Note:  This document was prepared using Dragon voice recognition software and may include unintentional dictation errors.   Pia Mau [place], PA-C 07/10/22 1622    Sharman Cheek, MD 07/11/22 2348

## 2022-07-10 NOTE — Discharge Instructions (Signed)
Please make follow up with your orthopedist/sports medicine provider.

## 2022-10-30 ENCOUNTER — Emergency Department: Payer: 59

## 2022-10-30 ENCOUNTER — Emergency Department
Admission: EM | Admit: 2022-10-30 | Discharge: 2022-10-30 | Disposition: A | Discharge status: Home / Self Care | Payer: 59 | Attending: Emergency Medicine | Admitting: Emergency Medicine

## 2022-10-30 ENCOUNTER — Other Ambulatory Visit: Payer: Self-pay

## 2022-10-30 DIAGNOSIS — R0602 Shortness of breath: Secondary | ICD-10-CM | POA: Insufficient documentation

## 2022-10-30 DIAGNOSIS — R1084 Generalized abdominal pain: Secondary | ICD-10-CM | POA: Diagnosis not present

## 2022-10-30 DIAGNOSIS — R0789 Other chest pain: Secondary | ICD-10-CM | POA: Insufficient documentation

## 2022-10-30 DIAGNOSIS — Z1152 Encounter for screening for COVID-19: Secondary | ICD-10-CM | POA: Insufficient documentation

## 2022-10-30 DIAGNOSIS — R079 Chest pain, unspecified: Secondary | ICD-10-CM | POA: Diagnosis present

## 2022-10-30 LAB — BASIC METABOLIC PANEL
Anion gap: 9 (ref 5–15)
BUN: 18 mg/dL (ref 8–23)
CO2: 22 mmol/L (ref 22–32)
Calcium: 9.5 mg/dL (ref 8.9–10.3)
Chloride: 107 mmol/L (ref 98–111)
Creatinine, Ser: 0.85 mg/dL (ref 0.44–1.00)
GFR, Estimated: >60 mL/min (ref >60)
Glucose, Bld: 139 mg/dL — ABNORMAL HIGH (ref 70–99)
Potassium: 4.2 mmol/L (ref 3.5–5.1)
Sodium: 138 mmol/L (ref 135–145)

## 2022-10-30 LAB — RESP PANEL BY RT-PCR (RSV, FLU A&B, COVID)  RVPGX2
Influenza A by PCR: NEGATIVE
Influenza B by PCR: NEGATIVE
Resp Syncytial Virus by PCR: NEGATIVE
SARS Coronavirus 2 by RT PCR: NEGATIVE

## 2022-10-30 LAB — HEPATIC FUNCTION PANEL
ALT: 20 U/L (ref 0–44)
AST: 21 U/L (ref 15–41)
Albumin: 3.3 g/dL — ABNORMAL LOW (ref 3.5–5.0)
Alkaline Phosphatase: 69 U/L (ref 38–126)
Bilirubin, Direct: <0.1 mg/dL (ref 0.0–0.2)
Total Bilirubin: 0.5 mg/dL (ref 0.3–1.2)
Total Protein: 5.8 g/dL — ABNORMAL LOW (ref 6.5–8.1)

## 2022-10-30 LAB — CBC
HCT: 44.1 % (ref 36.0–46.0)
Hemoglobin: 13.6 g/dL (ref 12.0–15.0)
MCH: 28.8 pg (ref 26.0–34.0)
MCHC: 30.8 g/dL (ref 30.0–36.0)
MCV: 93.4 fL (ref 80.0–100.0)
Platelets: 208 10*3/uL (ref 150–400)
RBC: 4.72 MIL/uL (ref 3.87–5.11)
RDW: 16.7 % — ABNORMAL HIGH (ref 11.5–15.5)
WBC: 6.2 10*3/uL (ref 4.0–10.5)
nRBC: 0 % (ref 0.0–0.2)

## 2022-10-30 LAB — TROPONIN I (HIGH SENSITIVITY)
Troponin I (High Sensitivity): 5 ng/L (ref <18)
Troponin I (High Sensitivity): 5 ng/L (ref <18)

## 2022-10-30 LAB — LIPASE, BLOOD: Lipase: 31 U/L (ref 11–51)

## 2022-10-30 MED ORDER — HYDROMORPHONE HCL 2 MG PO TABS
2.0000 mg | ORAL_TABLET | ORAL | Status: AC
  Administered 2022-10-30: 2 mg via ORAL
  Filled 2022-10-30: qty 1

## 2022-10-30 MED ORDER — HYDROMORPHONE HCL 1 MG/ML IJ SOLN
0.5000 mg | Freq: Once | INTRAMUSCULAR | Status: AC
  Administered 2022-10-30: 0.5 mg via INTRAVENOUS
  Filled 2022-10-30: qty 0.5

## 2022-10-30 MED ORDER — ONDANSETRON 4 MG PO TBDP: 4.0000 mg | ORAL_TABLET | Freq: Three times a day (TID) | ORAL | 0 refills | Status: DC | PRN

## 2022-10-30 MED ORDER — IOHEXOL 350 MG/ML SOLN
75.0000 mL | Freq: Once | INTRAVENOUS | Status: AC | PRN
  Administered 2022-10-30: 100 mL via INTRAVENOUS

## 2022-10-30 MED ORDER — ONDANSETRON HCL 4 MG/2ML IJ SOLN
4.0000 mg | Freq: Once | INTRAMUSCULAR | Status: AC
  Administered 2022-10-30: 4 mg via INTRAVENOUS
  Filled 2022-10-30: qty 2

## 2022-10-30 MED ORDER — HYDROMORPHONE HCL 1 MG/ML IJ SOLN
1.0000 mg | Freq: Once | INTRAMUSCULAR | Status: AC
  Administered 2022-10-30: 1 mg via INTRAVENOUS
  Filled 2022-10-30: qty 1

## 2022-10-30 MED ORDER — FAMOTIDINE 20 MG PO TABS: 20.0000 mg | ORAL_TABLET | Freq: Two times a day (BID) | ORAL | 0 refills | Status: AC

## 2022-10-30 MED ORDER — SODIUM CHLORIDE 0.9 % IV BOLUS
1000.0000 mL | Freq: Once | INTRAVENOUS | Status: AC
  Administered 2022-10-30: 1000 mL via INTRAVENOUS

## 2022-10-30 NOTE — ED Notes (Signed)
Patient transported to CT 

## 2022-10-30 NOTE — ED Notes (Signed)
Called lab to draw labs.

## 2022-10-30 NOTE — ED Notes (Signed)
Pt returned from CT °

## 2022-10-30 NOTE — ED Provider Notes (Signed)
[facility] Provider Note    Event Date/Time   First MD Initiated Contact with Patient 10/30/22 1507     (approximate)   History   Chief Complaint: Chest Pain   HPI  Betty Ferguson is a 62 y.o. female with a history of lupus, pancreatic cancer, rheumatoid arthritis, fibromyalgia who comes ED complaining of shortness of breath since yesterday, associated with fatigue.  No cough.  Today around 1:00 PM, she also developed right-sided sharp chest pain that is worse with breathing.  Not exertional.  No fever.  Tried taking a nebulizer treatment but that made her feel worse.  She has had intermittent episodes of dizziness with standing over the last few days as well.  No vomiting or diarrhea.  Dizziness did cause her to fall onto her right knee this morning which is painful.     Physical Exam   Triage Vital Signs: ED Triage Vitals  Enc Vitals Group     BP 10/30/22 1355 (!) 140/101     Pulse Rate 10/30/22 1355 80     Resp 10/30/22 1355 18     Temp 10/30/22 1355 98 F (36.7 C)     Temp Source 10/30/22 1355 Oral     SpO2 10/30/22 1355 98 %     Weight 10/30/22 1352 190 lb (86.2 kg)     Height 10/30/22 1352 '5\' 2"'$  (1.575 m)     Head Circumference --      Peak Flow --      Pain Score 10/30/22 1351 8     Pain Loc --      Pain Edu? --      Excl. in [place]? --     Most recent vital signs: Vitals:   10/30/22 1808 10/30/22 1830  BP:  (!) 158/80  Pulse:  69  Resp:  11  Temp: 98.2 F (36.8 C)   SpO2:  99%    General: Awake, no distress.  CV:  Good peripheral perfusion.  Regular rate and rhythm.  Symmetric distal pulses. Resp:  Normal effort.  Clear to auscultation bilaterally Abd:  No distention.  Soft with generalized tenderness Other:  Trace pitting edema bilateral lower extremities.  Symmetric calf circumference.   ED Results / Procedures / Treatments   Labs (all labs ordered are listed, but only abnormal results are displayed) Labs Reviewed  BASIC  METABOLIC PANEL - Abnormal; Notable for the following components:      Result Value   Glucose, Bld 139 (*)    All other components within normal limits  CBC - Abnormal; Notable for the following components:   RDW 16.7 (*)    All other components within normal limits  HEPATIC FUNCTION PANEL - Abnormal; Notable for the following components:   Total Protein 5.8 (*)    Albumin 3.3 (*)    All other components within normal limits  RESP PANEL BY RT-PCR (RSV, FLU A&B, COVID)  [id number]  LIPASE, BLOOD  TROPONIN I (HIGH SENSITIVITY)  TROPONIN I (HIGH SENSITIVITY)     EKG Interpreted by me Normal sinus rhythm rate of 64.  Normal axis and intervals.  Poor R wave progression.  Normal ST segments and T waves.   RADIOLOGY Chest x-ray interpreted by me, appears normal.  Radiology report reviewed.  CT chest abdomen pelvis pending   PROCEDURES:  .1-3 Lead EKG Interpretation  Performed by: Carrie Mew, MD Authorized by: Carrie Mew, MD     Interpretation: normal     ECG rate:  65   ECG rate assessment: normal     Rhythm: sinus rhythm     Ectopy: none     Conduction: normal   Comments:          Angiocath insertion  Date/Time: 10/30/2022 4:11 PM  Performed by: Carrie Mew, MD Authorized by: Carrie Mew, MD  Consent: Verbal consent obtained. Patient identity confirmed: verbally with patient Preparation: Patient was prepped and draped in the usual sterile fashion. Local anesthesia used: no  Anesthesia: Local anesthesia used: no  Sedation: Patient sedated: no  Patient tolerance: patient tolerated the procedure well with no immediate complications Comments: 62B, R AC, 1 attempt, EBL 0, median cubital vein, continuous [place] visualization      MEDICATIONS ORDERED IN ED: Medications  HYDROmorphone (DILAUDID) tablet 2 mg (has no administration in time range)  sodium chloride 0.9 % bolus 1,000 mL (0 mLs Intravenous Stopped 10/30/22 1715)  ondansetron  (ZOFRAN) injection 4 mg (4 mg Intravenous Given 10/30/22 1606)  HYDROmorphone (DILAUDID) injection 0.5 mg (0.5 mg Intravenous Given 10/30/22 1606)  HYDROmorphone (DILAUDID) injection 1 mg (1 mg Intravenous Given 10/30/22 1719)  iohexol (OMNIPAQUE) 350 MG/ML injection 75 mL (100 mLs Intravenous Contrast Given 10/30/22 1725)     IMPRESSION / MDM / ASSESSMENT AND PLAN / ED COURSE  I reviewed the triage vital signs and the nursing notes.  DDx: Non-STEMI, PE, pancreatitis, diverticulitis, pneumonia, ileus, viral illness, musculoskeletal pain  Patient's presentation is most consistent with acute presentation with potential threat to life or bodily function.  Patient presents with chest pain which may be musculoskeletal, but with sharp and ongoing nature and her past medical history, will need to perform CT chest to rule out PE.  She also has abdominal tenderness diffusely.  Suspect this is viral, but again with her surgical history will obtain a CT.  Will give IV fluids, IV Dilaudid for hydration and pain relief.    ----------------------------------------- 6:56 PM on 10/30/2022 ----------------------------------------- Pain improved.  Vitals normal.  Feeling better after IV fluids.  Workup entirely reassuring including labs, serial troponins, CT chest abdomen pelvis.  Negative for PE, injury, or intra-abdominal pathology.  Viral respiratory swab negative, but given the constellation of symptoms and reassuring workup, I suspect the patient is experiencing a viral syndrome which can be expected to be self-limited.  Will provide some prescriptions for supportive care at home.  Patient is comfortable with discharge and outpatient follow-up.  She does not require admission.      FINAL CLINICAL IMPRESSION(S) / ED DIAGNOSES   Final diagnoses:  Atypical chest pain  Generalized abdominal pain     Rx / DC Orders   ED Discharge Orders          Ordered    ondansetron (ZOFRAN-ODT) 4 MG  disintegrating tablet  Every 8 hours PRN        10/30/22 1856    famotidine (PEPCID) 20 MG tablet  2 times daily        10/30/22 1856             Note:  This document was prepared using Dragon voice recognition software and may include unintentional dictation errors.    Carrie Mew, MD 10/30/22 [phone]

## 2022-10-30 NOTE — ED Triage Notes (Signed)
Pt to ED [facility] from home for R chest pain, nonradiating and reproducible, 12 lead normal per EMS, started 30 minutes ago, rates as 8/10. Pt vomited prior to EMS arrival Pt was given '324mg'$  chewable aspirin VSS, 180/78 was BP  Pt states pain is sharp and worse with inspiration  Pt states has gotten dizzy several times in last several days and fell once last week after getting dizzy.  Respirations unlabored. Skin dry.

## 2023-06-11 ENCOUNTER — Inpatient Hospital Stay
Admission: EM | Admit: 2023-06-11 | Discharge: 2023-06-13 | DRG: 392 | Disposition: A | Discharge status: Home / Self Care | Payer: 59 | Attending: Osteopathic Medicine | Admitting: Osteopathic Medicine

## 2023-06-11 ENCOUNTER — Emergency Department: Payer: 59

## 2023-06-11 ENCOUNTER — Other Ambulatory Visit: Payer: Self-pay

## 2023-06-11 ENCOUNTER — Observation Stay: Payer: 59

## 2023-06-11 DIAGNOSIS — M069 Rheumatoid arthritis, unspecified: Secondary | ICD-10-CM | POA: Diagnosis present

## 2023-06-11 DIAGNOSIS — Z7982 Long term (current) use of aspirin: Secondary | ICD-10-CM

## 2023-06-11 DIAGNOSIS — K439 Ventral hernia without obstruction or gangrene: Secondary | ICD-10-CM | POA: Diagnosis present

## 2023-06-11 DIAGNOSIS — E1165 Type 2 diabetes mellitus with hyperglycemia: Secondary | ICD-10-CM | POA: Diagnosis present

## 2023-06-11 DIAGNOSIS — Z8507 Personal history of malignant neoplasm of pancreas: Secondary | ICD-10-CM

## 2023-06-11 DIAGNOSIS — Z79899 Other long term (current) drug therapy: Secondary | ICD-10-CM

## 2023-06-11 DIAGNOSIS — N281 Cyst of kidney, acquired: Secondary | ICD-10-CM | POA: Diagnosis present

## 2023-06-11 DIAGNOSIS — R52 Pain, unspecified: Secondary | ICD-10-CM | POA: Diagnosis not present

## 2023-06-11 DIAGNOSIS — Z87891 Personal history of nicotine dependence: Secondary | ICD-10-CM

## 2023-06-11 DIAGNOSIS — R1032 Left lower quadrant pain: Principal | ICD-10-CM

## 2023-06-11 DIAGNOSIS — Z885 Allergy status to narcotic agent status: Secondary | ICD-10-CM

## 2023-06-11 DIAGNOSIS — I700 Atherosclerosis of aorta: Secondary | ICD-10-CM | POA: Insufficient documentation

## 2023-06-11 DIAGNOSIS — Z8505 Personal history of malignant neoplasm of liver: Secondary | ICD-10-CM

## 2023-06-11 DIAGNOSIS — E785 Hyperlipidemia, unspecified: Secondary | ICD-10-CM | POA: Diagnosis present

## 2023-06-11 DIAGNOSIS — E669 Obesity, unspecified: Secondary | ICD-10-CM | POA: Diagnosis present

## 2023-06-11 DIAGNOSIS — Z6835 Body mass index (BMI) 35.0-35.9, adult: Secondary | ICD-10-CM

## 2023-06-11 DIAGNOSIS — I10 Essential (primary) hypertension: Secondary | ICD-10-CM | POA: Diagnosis present

## 2023-06-11 DIAGNOSIS — M797 Fibromyalgia: Secondary | ICD-10-CM | POA: Diagnosis present

## 2023-06-11 DIAGNOSIS — I719 Aortic aneurysm of unspecified site, without rupture: Secondary | ICD-10-CM | POA: Diagnosis present

## 2023-06-11 DIAGNOSIS — Z8249 Family history of ischemic heart disease and other diseases of the circulatory system: Secondary | ICD-10-CM

## 2023-06-11 DIAGNOSIS — G8929 Other chronic pain: Secondary | ICD-10-CM | POA: Diagnosis present

## 2023-06-11 DIAGNOSIS — Z881 Allergy status to other antibiotic agents status: Secondary | ICD-10-CM

## 2023-06-11 DIAGNOSIS — K219 Gastro-esophageal reflux disease without esophagitis: Secondary | ICD-10-CM | POA: Diagnosis present

## 2023-06-11 DIAGNOSIS — Z23 Encounter for immunization: Secondary | ICD-10-CM

## 2023-06-11 DIAGNOSIS — Z7984 Long term (current) use of oral hypoglycemic drugs: Secondary | ICD-10-CM

## 2023-06-11 DIAGNOSIS — Z923 Personal history of irradiation: Secondary | ICD-10-CM

## 2023-06-11 DIAGNOSIS — M329 Systemic lupus erythematosus, unspecified: Secondary | ICD-10-CM | POA: Diagnosis present

## 2023-06-11 DIAGNOSIS — E119 Type 2 diabetes mellitus without complications: Secondary | ICD-10-CM

## 2023-06-11 LAB — URINALYSIS, ROUTINE W REFLEX MICROSCOPIC
Bilirubin Urine: NEGATIVE
Glucose, UA: 50 mg/dL — AB
Hgb urine dipstick: NEGATIVE
Ketones, ur: NEGATIVE mg/dL
Leukocytes,Ua: NEGATIVE
Nitrite: NEGATIVE
Protein, ur: NEGATIVE mg/dL
Specific Gravity, Urine: 1.012 (ref 1.005–1.030)
pH: 5 (ref 5.0–8.0)

## 2023-06-11 LAB — CBC
HCT: 40.2 % (ref 36.0–46.0)
Hemoglobin: 12.6 g/dL (ref 12.0–15.0)
MCH: 30.2 pg (ref 26.0–34.0)
MCHC: 31.3 g/dL (ref 30.0–36.0)
MCV: 96.4 fL (ref 80.0–100.0)
Platelets: 267 10*3/uL (ref 150–400)
RBC: 4.17 MIL/uL (ref 3.87–5.11)
RDW: 15.4 % (ref 11.5–15.5)
WBC: 8.3 10*3/uL (ref 4.0–10.5)
nRBC: 0 % (ref 0.0–0.2)

## 2023-06-11 LAB — COMPREHENSIVE METABOLIC PANEL
ALT: 19 U/L (ref 0–44)
AST: 22 U/L (ref 15–41)
Albumin: 3.5 g/dL (ref 3.5–5.0)
Alkaline Phosphatase: 87 U/L (ref 38–126)
Anion gap: 9 (ref 5–15)
BUN: 14 mg/dL (ref 8–23)
CO2: 25 mmol/L (ref 22–32)
Calcium: 9.1 mg/dL (ref 8.9–10.3)
Chloride: 105 mmol/L (ref 98–111)
Creatinine, Ser: 0.84 mg/dL (ref 0.44–1.00)
GFR, Estimated: >60 mL/min (ref >60)
Glucose, Bld: 187 mg/dL — ABNORMAL HIGH (ref 70–99)
Potassium: 3.9 mmol/L (ref 3.5–5.1)
Sodium: 139 mmol/L (ref 135–145)
Total Bilirubin: 0.6 mg/dL (ref 0.3–1.2)
Total Protein: 6.5 g/dL (ref 6.5–8.1)

## 2023-06-11 LAB — LACTIC ACID, PLASMA: Lactic Acid, Venous: 0.9 mmol/L (ref 0.5–1.9)

## 2023-06-11 LAB — LIPASE, BLOOD: Lipase: 27 U/L (ref 11–51)

## 2023-06-11 LAB — GLUCOSE, CAPILLARY: Glucose-Capillary: 203 mg/dL — ABNORMAL HIGH (ref 70–99)

## 2023-06-11 MED ORDER — OXYCODONE HCL 5 MG PO TABS
10.0000 mg | ORAL_TABLET | Freq: Once | ORAL | Status: AC
  Administered 2023-06-11: 10 mg via ORAL
  Filled 2023-06-11: qty 2

## 2023-06-11 MED ORDER — FENTANYL CITRATE PF 50 MCG/ML IJ SOSY
50.0000 ug | PREFILLED_SYRINGE | INTRAMUSCULAR | Status: DC | PRN
  Administered 2023-06-11 – 2023-06-12 (×3): 50 ug via INTRAVENOUS
  Filled 2023-06-11 (×3): qty 1

## 2023-06-11 MED ORDER — ONDANSETRON HCL 4 MG/2ML IJ SOLN
4.0000 mg | Freq: Four times a day (QID) | INTRAMUSCULAR | Status: DC | PRN
  Administered 2023-06-11 – 2023-06-12 (×3): 4 mg via INTRAVENOUS
  Filled 2023-06-11 (×3): qty 2

## 2023-06-11 MED ORDER — HYDROMORPHONE HCL 1 MG/ML IJ SOLN
1.0000 mg | INTRAMUSCULAR | Status: AC
  Administered 2023-06-11: 1 mg via INTRAVENOUS
  Filled 2023-06-11: qty 1

## 2023-06-11 MED ORDER — SODIUM CHLORIDE 0.9 % IV SOLN: 12.5000 mg | Freq: Four times a day (QID) | INTRAVENOUS | Status: AC | PRN

## 2023-06-11 MED ORDER — FOLIC ACID 1 MG PO TABS
1.0000 mg | ORAL_TABLET | Freq: Every day | ORAL | Status: DC
  Administered 2023-06-12 – 2023-06-13 (×2): 1 mg via ORAL
  Filled 2023-06-11 (×2): qty 1

## 2023-06-11 MED ORDER — GABAPENTIN 300 MG PO CAPS
600.0000 mg | ORAL_CAPSULE | Freq: Two times a day (BID) | ORAL | Status: DC
  Administered 2023-06-11 – 2023-06-13 (×4): 600 mg via ORAL
  Filled 2023-06-11 (×4): qty 2

## 2023-06-11 MED ORDER — ONDANSETRON HCL 4 MG PO TABS: 4.0000 mg | ORAL_TABLET | Freq: Four times a day (QID) | ORAL | Status: DC | PRN

## 2023-06-11 MED ORDER — SENNOSIDES-DOCUSATE SODIUM 8.6-50 MG PO TABS: 1.0000 | ORAL_TABLET | Freq: Every evening | ORAL | Status: DC | PRN

## 2023-06-11 MED ORDER — ACETAMINOPHEN 325 MG PO TABS
650.0000 mg | ORAL_TABLET | Freq: Four times a day (QID) | ORAL | Status: DC | PRN
  Administered 2023-06-12: 650 mg via ORAL
  Filled 2023-06-11: qty 2

## 2023-06-11 MED ORDER — HYDROMORPHONE HCL 1 MG/ML IJ SOLN
0.5000 mg | INTRAMUSCULAR | Status: DC | PRN
  Administered 2023-06-11 – 2023-06-12 (×2): 0.5 mg via INTRAVENOUS
  Filled 2023-06-11 (×2): qty 0.5

## 2023-06-11 MED ORDER — ENOXAPARIN SODIUM 40 MG/0.4ML IJ SOSY
40.0000 mg | PREFILLED_SYRINGE | INTRAMUSCULAR | Status: DC
  Administered 2023-06-11 – 2023-06-12 (×2): 40 mg via SUBCUTANEOUS
  Filled 2023-06-11 (×2): qty 0.4

## 2023-06-11 MED ORDER — FAMOTIDINE 20 MG PO TABS
20.0000 mg | ORAL_TABLET | Freq: Two times a day (BID) | ORAL | Status: DC
  Administered 2023-06-11 – 2023-06-13 (×4): 20 mg via ORAL
  Filled 2023-06-11 (×4): qty 1

## 2023-06-11 MED ORDER — TRAZODONE HCL 50 MG PO TABS: 50.0000 mg | ORAL_TABLET | Freq: Every day | ORAL | Status: DC

## 2023-06-11 MED ORDER — HYDROMORPHONE HCL 1 MG/ML IJ SOLN
0.5000 mg | INTRAMUSCULAR | Status: AC
  Administered 2023-06-11: 0.5 mg via INTRAVENOUS
  Filled 2023-06-11: qty 0.5

## 2023-06-11 MED ORDER — ASPIRIN 81 MG PO CHEW
81.0000 mg | CHEWABLE_TABLET | Freq: Every day | ORAL | Status: DC
  Administered 2023-06-12 – 2023-06-13 (×2): 81 mg via ORAL
  Filled 2023-06-11 (×2): qty 1

## 2023-06-11 MED ORDER — INSULIN ASPART 100 UNIT/ML IJ SOLN
0.0000 [IU] | Freq: Three times a day (TID) | INTRAMUSCULAR | Status: DC
  Administered 2023-06-12: 2 [IU] via SUBCUTANEOUS
  Administered 2023-06-12: 3 [IU] via SUBCUTANEOUS
  Administered 2023-06-12: 2 [IU] via SUBCUTANEOUS
  Administered 2023-06-13: 3 [IU] via SUBCUTANEOUS
  Filled 2023-06-11 (×3): qty 1

## 2023-06-11 MED ORDER — PREDNISONE 2.5 MG PO TABS
2.5000 mg | ORAL_TABLET | Freq: Every day | ORAL | Status: DC
  Administered 2023-06-12 – 2023-06-13 (×2): 2.5 mg via ORAL
  Filled 2023-06-11 (×2): qty 1

## 2023-06-11 MED ORDER — PANTOPRAZOLE SODIUM 40 MG PO TBEC
40.0000 mg | DELAYED_RELEASE_TABLET | Freq: Every day | ORAL | Status: DC
  Administered 2023-06-12 – 2023-06-13 (×2): 40 mg via ORAL
  Filled 2023-06-11 (×2): qty 1

## 2023-06-11 MED ORDER — INSULIN ASPART 100 UNIT/ML IJ SOLN
0.0000 [IU] | Freq: Every day | INTRAMUSCULAR | Status: DC
  Administered 2023-06-11: 2 [IU] via SUBCUTANEOUS
  Filled 2023-06-11: qty 1

## 2023-06-11 MED ORDER — ONDANSETRON HCL 4 MG/2ML IJ SOLN
4.0000 mg | INTRAMUSCULAR | Status: AC
  Administered 2023-06-11: 4 mg via INTRAVENOUS
  Filled 2023-06-11: qty 2

## 2023-06-11 MED ORDER — SODIUM CHLORIDE 0.9 % IV BOLUS
500.0000 mL | Freq: Once | INTRAVENOUS | Status: AC
  Administered 2023-06-11: 500 mL via INTRAVENOUS

## 2023-06-11 MED ORDER — DOCUSATE SODIUM 100 MG PO CAPS
100.0000 mg | ORAL_CAPSULE | Freq: Every day | ORAL | Status: DC
  Administered 2023-06-11 – 2023-06-13 (×3): 100 mg via ORAL
  Filled 2023-06-11 (×3): qty 1

## 2023-06-11 MED ORDER — QUETIAPINE FUMARATE 25 MG PO TABS
100.0000 mg | ORAL_TABLET | Freq: Every day | ORAL | Status: DC
  Administered 2023-06-11 – 2023-06-12 (×2): 100 mg via ORAL
  Filled 2023-06-11 (×2): qty 4

## 2023-06-11 MED ORDER — HYDROMORPHONE HCL 1 MG/ML IJ SOLN: 1.0000 mg | INTRAMUSCULAR | Status: DC | PRN

## 2023-06-11 MED ORDER — LACTATED RINGERS IV SOLN: INTRAVENOUS | Status: DC

## 2023-06-11 MED ORDER — CYCLOBENZAPRINE HCL 10 MG PO TABS
10.0000 mg | ORAL_TABLET | Freq: Three times a day (TID) | ORAL | Status: DC | PRN
  Administered 2023-06-12 (×2): 10 mg via ORAL
  Filled 2023-06-11 (×2): qty 1

## 2023-06-11 MED ORDER — LIDOCAINE 5 % EX PTCH
1.0000 | MEDICATED_PATCH | CUTANEOUS | Status: DC
  Administered 2023-06-11 – 2023-06-12 (×2): 1 via TRANSDERMAL
  Filled 2023-06-11 (×3): qty 1

## 2023-06-11 MED ORDER — FLUTICASONE PROPIONATE 50 MCG/ACT NA SUSP: 2.0000 | Freq: Every day | NASAL | Status: DC | PRN

## 2023-06-11 MED ORDER — ATORVASTATIN CALCIUM 20 MG PO TABS
40.0000 mg | ORAL_TABLET | Freq: Every day | ORAL | Status: DC
  Administered 2023-06-12: 40 mg via ORAL
  Filled 2023-06-11: qty 2

## 2023-06-11 MED ORDER — HYDROMORPHONE HCL 1 MG/ML IJ SOLN
1.0000 mg | Freq: Once | INTRAMUSCULAR | Status: AC
  Administered 2023-06-11: 1 mg via INTRAVENOUS
  Filled 2023-06-11: qty 1

## 2023-06-11 MED ORDER — ACETAMINOPHEN 650 MG RE SUPP: 650.0000 mg | Freq: Four times a day (QID) | RECTAL | Status: DC | PRN

## 2023-06-11 MED ORDER — GABAPENTIN 300 MG PO CAPS
300.0000 mg | ORAL_CAPSULE | Freq: Once | ORAL | Status: AC
  Administered 2023-06-11: 300 mg via ORAL
  Filled 2023-06-11: qty 1

## 2023-06-11 MED ORDER — CLONAZEPAM 0.5 MG PO TABS
0.5000 mg | ORAL_TABLET | Freq: Every evening | ORAL | Status: DC | PRN
  Administered 2023-06-11 – 2023-06-12 (×2): 0.5 mg via ORAL
  Filled 2023-06-11 (×3): qty 1

## 2023-06-11 MED ORDER — SODIUM CHLORIDE 0.9 % IV SOLN: 12.5000 mg | Freq: Four times a day (QID) | INTRAVENOUS | Status: DC | PRN

## 2023-06-11 MED ORDER — PNEUMOCOCCAL 20-VAL CONJ VACC 0.5 ML IM SUSY
0.5000 mL | PREFILLED_SYRINGE | INTRAMUSCULAR | Status: AC
  Administered 2023-06-12: 0.5 mL via INTRAMUSCULAR
  Filled 2023-06-11: qty 0.5

## 2023-06-11 MED ORDER — IOHEXOL 300 MG/ML  SOLN
100.0000 mL | Freq: Once | INTRAMUSCULAR | Status: AC | PRN
  Administered 2023-06-11: 100 mL via INTRAVENOUS

## 2023-06-11 NOTE — ED Notes (Signed)
ED TO INPATIENT HANDOFF REPORT  ED Nurse Name and Phone #: Delice Bison, RN  S Name/Age/Gender Montez Morita 62 y.o. female Room/Bed: ED10A/ED10A  Code Status   Code Status: Full Code  Home/SNF/Other Home Patient oriented to: self, place, time, and situation Is this baseline? Yes   Triage Complete: Triage complete  Chief Complaint Inadequate pain control [R52]  Triage Note Pt c/o lower abdominal/groin pain for the past few days. Pt has had nausea.   Allergies Allergies  Allergen Reactions   Toradol [Ketorolac Tromethamine] Itching   Doxycycline Hives   Morphine And Codeine     Level of Care/Admitting Diagnosis ED Disposition     ED Disposition  Admit   Condition  --   Comment  Hospital Area: [facility] [[id number]]  Level of Care: Telemetry Medical [104]  Covid Evaluation: Asymptomatic - no recent exposure (last 10 days) testing not required  Diagnosis: Inadequate pain control [720056]  Admitting Physician: COX, Nadyne Coombes [[id number]]  Attending Physician: COX, AMY N [id number]          B Medical/Surgery History Past Medical History:  Diagnosis Date   Cancer (HCC)    pancreatic   Collagen vascular disease (HCC)    Fibromyalgia    H/O liver cancer    Lupus (HCC)    Rheumatoid arthritis (HCC)    Past Surgical History:  Procedure Laterality Date   BACK SURGERY     CHOLECYSTECTOMY     LIVER SURGERY     PANCREAS SURGERY     PANCREATECTOMY     ROTATOR CUFF REPAIR Right    TOTAL ABDOMINAL HYSTERECTOMY       A IV Location/Drains/Wounds Patient Lines/Drains/Airways Status     Active Line/Drains/Airways     Name Placement date Placement time Site Days   Peripheral IV 06/11/23 20 G 1.88" Right;Upper Arm 06/11/23  1152  Arm  less than 1            Intake/Output Last 24 hours No intake or output data in the 24 hours ending 06/11/23 1731  Labs/Imaging Results for orders placed or performed during the hospital encounter of 06/11/23  (from the past 48 hour(s))  Lipase, blood     Status: None   Collection Time: 06/11/23  9:49 AM  Result Value Ref Range   Lipase 27 11 - 51 U/L    Comment: Performed at [facility], [address] [place], [place] [address]  Comprehensive metabolic panel     Status: Abnormal   Collection Time: 06/11/23  9:49 AM  Result Value Ref Range   Sodium 139 135 - 145 mmol/L   Potassium 3.9 3.5 - 5.1 mmol/L   Chloride 105 98 - 111 mmol/L   CO2 25 22 - 32 mmol/L   Glucose, Bld 187 (H) 70 - 99 mg/dL    Comment: Glucose reference range applies only to samples taken after fasting for at least 8 hours.   BUN 14 8 - 23 mg/dL   Creatinine, Ser 2.84 0.44 - 1.00 mg/dL   Calcium 9.1 8.9 - 13.2 mg/dL   Total Protein 6.5 6.5 - 8.1 g/dL   Albumin 3.5 3.5 - 5.0 g/dL   AST 22 15 - 41 U/L   ALT 19 0 - 44 U/L   Alkaline Phosphatase 87 38 - 126 U/L   Total Bilirubin 0.6 0.3 - 1.2 mg/dL   GFR, Estimated >44 >01 mL/min    Comment: (NOTE) Calculated using the CKD-EPI Creatinine Equation (2021)  Anion gap 9 5 - 15    Comment: Performed at [facility], [address] [place], [place] [address]  CBC     Status: None   Collection Time: 06/11/23  9:49 AM  Result Value Ref Range   WBC 8.3 4.0 - 10.5 K/uL   RBC 4.17 3.87 - 5.11 MIL/uL   Hemoglobin 12.6 12.0 - 15.0 g/dL   HCT 95.2 84.1 - 32.4 %   MCV 96.4 80.0 - 100.0 fL   MCH 30.2 26.0 - 34.0 pg   MCHC 31.3 30.0 - 36.0 g/dL   RDW 40.1 02.7 - 25.3 %   Platelets 267 150 - 400 K/uL   nRBC 0.0 0.0 - 0.2 %    Comment: Performed at [facility], [address] [place], [place] [address]  Urinalysis, Routine w reflex microscopic -Urine, Clean Catch     Status: Abnormal   Collection Time: 06/11/23  1:03 PM  Result Value Ref Range   Color, Urine YELLOW (A) YELLOW   APPearance HAZY (A) CLEAR   Specific Gravity, Urine 1.012 1.005 - 1.030   pH 5.0 5.0 - 8.0   Glucose, UA 50 (A) NEGATIVE mg/dL   Hgb urine dipstick NEGATIVE  NEGATIVE   Bilirubin Urine NEGATIVE NEGATIVE   Ketones, ur NEGATIVE NEGATIVE mg/dL   Protein, ur NEGATIVE NEGATIVE mg/dL   Nitrite NEGATIVE NEGATIVE   Leukocytes,Ua NEGATIVE NEGATIVE    Comment: Performed at [facility], [address] [place], [place] [address]  Lactic acid, plasma     Status: None   Collection Time: 06/11/23  2:41 PM  Result Value Ref Range   Lactic Acid, Venous 0.9 0.5 - 1.9 mmol/L    Comment: Performed at [facility], [address], [place], [place] [address]   CT ABDOMEN PELVIS W CONTRAST  Result Date: 06/11/2023 CLINICAL DATA:  Left lower quadrant abdominal pain. EXAM: CT ABDOMEN AND PELVIS WITH CONTRAST TECHNIQUE: Multidetector CT imaging of the abdomen and pelvis was performed using the standard protocol following bolus administration of intravenous contrast. RADIATION DOSE REDUCTION: This exam was performed according to the departmental dose-optimization program which includes automated exposure control, adjustment of the mA and/or kV according to patient size and/or use of iterative reconstruction technique. CONTRAST:  OMNIPAQUE IOHEXOL 300 MG/ML  SOLN COMPARISON:  October 30, 2022 FINDINGS: Lower chest: No acute abnormality. Hepatobiliary: No focal liver abnormality is seen. Status post cholecystectomy. No biliary dilatation. Pancreas: Post distal pancreatectomy. Spleen: Normal in size without focal abnormality. Adrenals/Urinary Tract: Large exophytic benign cyst off of the upper pole of the right kidney measures 3.6 cm. Otherwise no significant findings within the kidneys or adrenal glands. Normal urinary bladder. Stomach/Bowel: Stomach is within normal limits. Appendix appears normal. No evidence of bowel wall thickening, distention, or inflammatory changes. Vascular/Lymphatic: Advanced atherosclerotic disease of the aorta and its branches with calcified and noncalcified plaque. 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 Reproductive: Status post hysterectomy. No adnexal masses. Other: Fat containing anterior abdominal wall hernia. Musculoskeletal: No acute or significant osseous findings. L4-L5 posterior fusion with intact hardware. IMPRESSION: 1. No acute findings within the abdomen or pelvis. Stable changes of distal pancreatectomy. 2. Advanced atherosclerotic disease of the aorta and its branches with calcified and noncalcified plaque. 3. 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 Aortic Atherosclerosis (ICD10-I70.0). Electronically Signed   By: Sharlyne Pacas  Dimitrova M.D.   On: 06/11/2023 13:43    Pending Labs Unresulted Labs (From admission, onward)     Start     Ordered   06/12/23 0500  Basic metabolic panel  Tomorrow morning,   R        06/11/23 1714   06/12/23 0500  CBC  Tomorrow morning,   R        06/11/23 1714   06/11/23 1724  Hemoglobin A1c  Once,   R       Comments: To assess prior glycemic control    06/11/23 1723            Vitals/Pain Today's Vitals   06/11/23 1600 06/11/23 1630 06/11/23 1700 06/11/23 1730  BP: (!) 187/70 (!) 159/68 (!) 152/67   Pulse: (!) 59 73 68   Resp: 12 13 12    Temp:      TempSrc:      SpO2: 100% 95% 98%   PainSc:    7     Isolation Precautions No active isolations  Medications Medications  lidocaine (LIDODERM) 5 % 1 patch (1 patch Transdermal Patch Applied 06/11/23 1602)  acetaminophen (TYLENOL) tablet 650 mg (has no administration in time range)    Or  acetaminophen (TYLENOL) suppository 650 mg (has no administration in time range)  ondansetron (ZOFRAN) tablet 4 mg (has no administration in time range)    Or  ondansetron (ZOFRAN) injection 4 mg (has no administration in time range)  enoxaparin (LOVENOX) injection 40 mg (has no administration in time range)  senna-docusate (Senokot-S) tablet 1 tablet (has no administration in time  range)  HYDROmorphone (DILAUDID) injection 0.5 mg (has no administration in time range)  fentaNYL (SUBLIMAZE) injection 50 mcg (has no administration in time range)  promethazine (PHENERGAN) 12.5 mg in sodium chloride 0.9 % 50 mL IVPB (has no administration in time range)  HYDROmorphone (DILAUDID) injection 1 mg (has no administration in time range)  lactated ringers infusion ( Intravenous New Bag/Given 06/11/23 1730)  insulin aspart (novoLOG) injection 0-5 Units (has no administration in time range)  insulin aspart (novoLOG) injection 0-15 Units (has no administration in time range)  HYDROmorphone (DILAUDID) injection 0.5 mg (0.5 mg Intravenous Given 06/11/23 1153)  ondansetron (ZOFRAN) injection 4 mg (4 mg Intravenous Given 06/11/23 1153)  sodium chloride 0.9 % bolus 500 mL (0 mLs Intravenous Stopped 06/11/23 1322)  iohexol (OMNIPAQUE) 300 MG/ML solution 100 mL (100 mLs Intravenous Contrast Given 06/11/23 1222)  HYDROmorphone (DILAUDID) injection 0.5 mg (0.5 mg Intravenous Given 06/11/23 1300)  HYDROmorphone (DILAUDID) injection 1 mg (1 mg Intravenous Given 06/11/23 1457)  ondansetron (ZOFRAN) injection 4 mg (4 mg Intravenous Given 06/11/23 1457)  HYDROmorphone (DILAUDID) injection 1 mg (1 mg Intravenous Given 06/11/23 1602)  gabapentin (NEURONTIN) capsule 300 mg (300 mg Oral Given 06/11/23 1602)  oxyCODONE (Oxy IR/ROXICODONE) immediate release tablet 10 mg (10 mg Oral Given 06/11/23 1729)    Mobility walks     Focused Assessments Cardiac Assessment Handoff:    Lab Results  Component Value Date   TROPONINI <0.03 03/11/2019   No results found for: "DDIMER" Does the Patient currently have chest pain? No    R Recommendations: See Admitting Provider Note  Report given to:   Additional Notes:

## 2023-06-11 NOTE — ED Provider Notes (Signed)
62 year old female with past medical history of rheumatoid arthritis on multiple immunosuppressants, chronic pain, here with significant left lower quadrant abdominal pain.  Patient has had fairly broad workup including negative CT abdomen/pelvis which shows no acute abnormality.  CBC shows no leukocytosis or anemia.  CMP is unremarkable.  Urinalysis negative for signs of UTI.  Lactic acid is normal.  The distribution of her pain and allodynia noted on my exam is suggestive of possible radiculopathy or more likely early shingles.  She is severely immunosuppressed and has had several episodes of shingles in the past.  The distribution is along a dermatome.  Will trial additional analgesics, gabapentin, and the lidocaine patch.  If she is unable to obtain pain control, will admit but suspect she can be managed as an outpatient.  Pain remains severe. Will admit to medicine.   Shaune Pollack, MD 06/11/23 [phone]

## 2023-06-11 NOTE — Assessment & Plan Note (Signed)
Dilaudid 0.5 mg IV every 4 hours as needed for moderate pain, fentanyl 50 mcg IV every 4 hours as needed for severe pain

## 2023-06-11 NOTE — Assessment & Plan Note (Addendum)
Fat-containing, informed patient of this finding, continue to monitor outpatient

## 2023-06-11 NOTE — Assessment & Plan Note (Addendum)
Fibromyalgia PDMP reviewed

## 2023-06-11 NOTE — ED Provider Notes (Signed)
[facility] Provider Note    Event Date/Time   First MD Initiated Contact with Patient 06/11/23 [phone]     (approximate)   History   Abdominal Pain   HPI  Betty Ferguson is a 62 y.o. female with a noted history of lupus, vascular collagen vascular disease, liver cancer and rheumatoid also notation of pancreatic cancer   Has been experiencing left lower abdominal pain increasing for about the last 24 hours.  Associated with nausea and has caused her to vomit.  Her bowel movements have remained normal no black or bloody.  She reports primarily increasing pain shooting along the left lower abdomen and soreness to touch.  There is notation of a family medicine visit which I reviewed from June 09, 2023, however it does not seem to yet be fully completed.  There is notation of a prescription for Ozempic and that a urinalysis was completed showing 2+ glucose but no ketones, no leukocytes no nitrates Physical Exam   Triage Vital Signs: ED Triage Vitals [06/11/23 0946]  Encounter Vitals Group     BP (!) 166/86     Systolic BP Percentile      Diastolic BP Percentile      Pulse Rate 69     Resp 18     Temp 97.7 F (36.5 C)     Temp Source Oral     SpO2 99 %     Weight      Height      Head Circumference      Peak Flow      Pain Score 10     Pain Loc      Pain Education      Exclude from Growth Chart     Most recent vital signs: Vitals:   06/11/23 0946  BP: (!) 166/86  Pulse: 69  Resp: 18  Temp: 97.7 F (36.5 C)  SpO2: 99%     General: Awake, no distress.  She does appear in some discomfort reporting pain along the left lower abdomen.  She denies having back pain.  No fevers or chills.  No chest pain or shortness of breath CV:  Good peripheral perfusion.  Normal tones and rate Resp:  Normal effort.  Clear bilateral Abd:  No distention.  Soft nontender nondistended throughout with exception of the left mid abdomen and left lower quadrant she  reports moderate tenderness without rebound or guarding.  There is no overlying lesion or rash.  No tenderness to percussion of costovertebral angles or rash noted over the back bilaterally. Other:  Strong palpable intact pulses in the lower extremities including dorsalis pedis and posterior tibial bilateral.  Warm well-perfused lower extremities   ED Results / Procedures / Treatments   Labs (all labs ordered are listed, but only abnormal results are displayed) Labs Reviewed  LIPASE, BLOOD  COMPREHENSIVE METABOLIC PANEL  CBC  URINALYSIS, ROUTINE W REFLEX MICROSCOPIC      RADIOLOGY  CT imaging reviewed by me and deemed grossly negative for acute pathology.  Await radiology report  CT ABDOMEN PELVIS W CONTRAST  Result Date: 06/11/2023 CLINICAL DATA:  Left lower quadrant abdominal pain. EXAM: CT ABDOMEN AND PELVIS WITH CONTRAST TECHNIQUE: Multidetector CT imaging of the abdomen and pelvis was performed using the standard protocol following bolus administration of intravenous contrast. RADIATION DOSE REDUCTION: This exam was performed according to the departmental dose-optimization program which includes automated exposure control, adjustment of the mA and/or kV according to patient size and/or use  of iterative reconstruction technique. CONTRAST:  OMNIPAQUE IOHEXOL 300 MG/ML  SOLN COMPARISON:  October 30, 2022 FINDINGS: Lower chest: No acute abnormality. Hepatobiliary: No focal liver abnormality is seen. Status post cholecystectomy. No biliary dilatation. Pancreas: Post distal pancreatectomy. Spleen: Normal in size without focal abnormality. Adrenals/Urinary Tract: Large exophytic benign cyst off of the upper pole of the right kidney measures 3.6 cm. Otherwise no significant findings within the kidneys or adrenal glands. Normal urinary bladder. Stomach/Bowel: Stomach is within normal limits. Appendix appears normal. No evidence of bowel wall thickening, distention, or inflammatory changes.  Vascular/Lymphatic: Advanced atherosclerotic disease of the aorta and its branches with calcified and noncalcified plaque. 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 Reproductive: Status post hysterectomy. No adnexal masses. Other: Fat containing anterior abdominal wall hernia. Musculoskeletal: No acute or significant osseous findings. L4-L5 posterior fusion with intact hardware. IMPRESSION: 1. No acute findings within the abdomen or pelvis. Stable changes of distal pancreatectomy. 2. Advanced atherosclerotic disease of the aorta and its branches with calcified and noncalcified plaque. 3. 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 Aortic Atherosclerosis (ICD10-I70.0). Electronically Signed   By: Ted Mcalpine M.D.   On: 06/11/2023 13:43      PROCEDURES:  Critical Care performed: No  Procedures   MEDICATIONS ORDERED IN ED: Medications - No data to display   IMPRESSION / MDM / ASSESSMENT AND PLAN / ED COURSE  I reviewed the triage vital signs and the nursing notes.                              Differential diagnosis includes but is not limited to, abdominal perforation, aortic dissection, cholecystitis, appendicitis, diverticulitis, colitis, esophagitis/gastritis, kidney stone, pyelonephritis, urinary tract infection, aortic aneurysm. All are considered in decision and treatment plan. Based upon the patient's presentation and risk factors, including immunosuppression and focal left lower quadrant tenderness feel it would be prudent to further evaluate with CT imaging.  Her workup including labs are quite reassuring to this point.  Patient's presentation is most consistent with acute complicated illness / injury requiring diagnostic workup.      Clinical Course as of 06/11/23 1657  Sat Jun 11, 2023  1445 Reexamination patient still having  ongoing left lower quadrant pain.  Remains mildly tender without rebound or guarding in the left lower and left mid abdomen.  She also reports mild nausea.  CT imaging reassuring.  Chronic small aneurysm, does not appear to have associated acute change.  She has strong dorsalis pedis and posterior tibial pulses in the lower extremities bilaterally.  I will give additional hydromorphone, if she is able to tolerate by mouth pain well-controlled I suspect she can likely discharge if normal lactic acid.  Send lactic, though I doubt this is acute vascular ischemia if it is elevated that would raise further concern [MQ]    Clinical Course User Index [MQ] Sharyn Creamer, MD     FINAL CLINICAL IMPRESSION(S) / ED DIAGNOSES   Final diagnoses:  None     Rx / DC Orders   ED Discharge Orders     None        Note:  This document was prepared using Dragon voice recognition software and may include unintentional dictation errors.   Sharyn Creamer, MD 06/11/23 [phone]

## 2023-06-11 NOTE — ED Notes (Signed)
Patient transported to CT 

## 2023-06-11 NOTE — Hospital Course (Addendum)
Ms. Betty Ferguson is a 63 year old female with history of rheumatoid arthritis, history of pancreatic and liver cancer status post surgical resection, aortic atherosclerosis, non-insulin-dependent diabetes mellitus, who presents to the emergency department for chief concerns of nausea and abdominal pain.  Vitals in the ED showed temperature of 97.6, respiration rate of 15, heart rate of 62, blood pressure 147/84, SpO2 100% on room air.  Serum sodium is 139, potassium 2.9, chloride 105, bicarb 25, BUN of 14, serum creatinine of 0.84, EGFR greater than 60, nonfasting blood glucose 187, WBC 8.3, hemoglobin 12.6, platelets of 267.  Lactic acid is 0.9.  Lipase was 27.  ED treatment: Gabapentin 300 mg p.o. one-time dose, Dilaudid 0.5 mg IV x 2 doses, Dilaudid 1 mg IV x 2 doses, ondansetron 4 mg IV 2 doses were given, sodium chloride 500 mL bolus.

## 2023-06-11 NOTE — Assessment & Plan Note (Signed)
Read as large exophytic benign cyst Informed patient of this incidental finding, and recommend outpatient monitor Patient endorses understanding

## 2023-06-11 NOTE — H&P (Addendum)
History and Physical   Phoenyx Gacia WUJ:[id number] DOB: [birth year] DOA: 06/11/2023  PCP: Shawn Stall, MD  Outpatient Specialists: Dr. Donneta Romberg, medical oncology Patient coming from: Home  I have personally briefly reviewed patient's old medical records in [facility] EMR.  Chief Concern: Abdominal pain  HPI: Ms. Betty Ferguson is a 62 year old female with history of rheumatoid arthritis, history of pancreatic and liver cancer status post surgical resection, aortic atherosclerosis, non-insulin-dependent diabetes mellitus, who presents to the emergency department for chief concerns of nausea and abdominal pain.  Vitals in the ED showed temperature of 97.6, respiration rate of 15, heart rate of 62, blood pressure 147/84, SpO2 100% on room air.  Serum sodium is 139, potassium 2.9, chloride 105, bicarb 25, BUN of 14, serum creatinine of 0.84, EGFR greater than 60, nonfasting blood glucose 187, WBC 8.3, hemoglobin 12.6, platelets of 267.  Lactic acid is 0.9.  Lipase was 27.  ED treatment: Gabapentin 300 mg p.o. one-time dose, Dilaudid 0.5 mg IV x 2 doses, Dilaudid 1 mg IV x 2 doses, ondansetron 4 mg IV 2 doses were given, sodium chloride 500 mL bolus. --------------------------------------- At bedside, patient able to tell me her name, age, location, current, the year.  She reports the pain started on 9/6.  She denies trauma to her person.  She denies known sick contacts.  She reports this pain is not a burning sensation and does not feel similar to her prior episodes of shingles.  She reports the pain is not worse with eating and it is unlike any pain she is ever felt before.  She reports the pain is 10 out of 10.  She denies fever.  She endorses chills, nausea, vomiting.  She states that she vomited 2 times today at home and it was white sputum.  She reports yesterday she had some bile in her vomitus.  She denies coffee-ground, black vomitus and black stool.  She reports she had a regular  bowel movement 2 days ago and it was brown in color.  She reports that she does not have a bowel movement every day.  She denies any known bug bites however reports that her right lower leg has been having a lot of bug marks though she does not see any bugs or spiders.  She denies seeing any black widow's spider recently.  Social history: She is a former tobacco user, quitting about 3 years ago.  ROS: Constitutional: no weight change, no fever ENT/Mouth: no sore throat, no rhinorrhea Eyes: no eye pain, no vision changes Cardiovascular: no chest pain, no dyspnea,  no edema, no palpitations Respiratory: no cough, no sputum, no wheezing Gastrointestinal: + nausea, + vomiting, no diarrhea, no constipation Genitourinary: no urinary incontinence, no dysuria, no hematuria Musculoskeletal: no arthralgias, no myalgias Skin: no skin lesions, no pruritus, Neuro: + weakness, no loss of consciousness, no syncope Psych: no anxiety, no depression, + decrease appetite Heme/Lymph: no bruising, no bleeding  ED Course: Discussed with emergency medicine provider, patient requiring hospitalization for chief concerns of pain control.  Assessment/Plan  Principal Problem:   Inadequate pain control Active Problems:   Left lower quadrant abdominal pain   Rheumatoid arthritis (HCC)   Aortic atherosclerosis (HCC)   Hyperlipidemia   Diabetes mellitus type 2, noninsulin dependent (HCC)   GERD (gastroesophageal reflux disease)   Chronic pain   Cyst of right kidney   Abdominal wall hernia   Assessment and Plan:  * Inadequate pain control Dilaudid 0.5 mg IV every 4 hours as  needed for moderate pain, fentanyl 50 mcg IV every 4 hours as needed for severe pain  Left lower quadrant abdominal pain With radiation to left flank, left posterior hip Etiology workup in progress query exacerbation of rheumatoid arthritis flare While the pain is consistent with a single dermatome, however patient symptoms is not  consistent with shingles And I do not appreciate any rash in her left lower quadrant abdominal to left flank and left hip/back Left hip x-ray 3 views ordered Symptomatic support: Oxycodone 0.5 mg IV every 4 hours as needed for moderate pain, 20 hours of coverage ordered; fentanyl 50 mcg IV every 4 hours as needed for severe pain, 20 hours of coverage ordered; Dilaudid 1 mg IV every 4 hours as needed for severe pain not responsive to IV Dilaudid 0.5 mg or fentanyl 50 mcg IV, 20 hours of coverage ordered  Abdominal wall hernia Fat-containing, informed patient of this finding, continue to monitor outpatient  Cyst of right kidney Read as large exophytic benign cyst Informed patient of this incidental finding, and recommend outpatient monitor Patient endorses understanding  Chronic pain Fibromyalgia PDMP reviewed  Diabetes mellitus type 2, noninsulin dependent (HCC) Home metformin not resumed on admission Insulin SSI with at bedtime coverage ordered Goal inpatient blood glucose levels 140-180  Chart reviewed.   DVT prophylaxis: Enoxaparin 40 mg subcutaneous Code Status: Full code Diet: Diabetic diet/carb modified Family Communication: Updated family at bedside with patient's permission Disposition Plan: Pending clinical course Consults called: None at this time Admission status: Telemetry medical, observation  Past Medical History:  Diagnosis Date   Cancer (HCC)    pancreatic   Collagen vascular disease (HCC)    Fibromyalgia    H/O liver cancer    Lupus (HCC)    Rheumatoid arthritis (HCC)    Past Surgical History:  Procedure Laterality Date   BACK SURGERY     CHOLECYSTECTOMY     LIVER SURGERY     PANCREAS SURGERY     PANCREATECTOMY     ROTATOR CUFF REPAIR Right    TOTAL ABDOMINAL HYSTERECTOMY     Social History:  reports that she quit smoking about 4 years ago. Her smoking use included cigarettes. She has never used smokeless tobacco. She reports that she does not drink  alcohol and does not use drugs.  Allergies  Allergen Reactions   Toradol [Ketorolac Tromethamine] Itching   Doxycycline Hives   Morphine And Codeine    Family History  Problem Relation Age of Onset   Coronary artery disease Father    Heart disease Sister    Family history: Family history reviewed and not pertinent.  Prior to Admission medications   Medication Sig Start Date End Date Taking? Authorizing Provider  aspirin 81 MG chewable tablet Chew by mouth daily.    [provider]  atorvastatin (LIPITOR) 40 MG tablet Take 40 mg by mouth daily.    [provider]  cetirizine (ZYRTEC) 10 MG tablet Take 10 mg by mouth daily.    [provider]  clonazePAM (KLONOPIN) 0.5 MG tablet Take 0.5 mg by mouth at bedtime as needed for anxiety.    [provider]  cyclobenzaprine (FLEXERIL) 10 MG tablet Take 1 tablet (10 mg total) by mouth 3 (three) times daily as needed for muscle spasms. 03/27/22   Shaune Pollack, MD  docusate sodium (COLACE) 100 MG capsule Take 100 mg by mouth daily as needed for mild constipation.    [provider]  ergocalciferol (VITAMIN D2) 1.25 MG (  50000 UT) capsule Take 50,000 Units by mouth once a week.    [provider]  famotidine (PEPCID) 20 MG tablet Take 1 tablet (20 mg total) by mouth 2 (two) times daily. 10/30/22   Sharman Cheek, MD  fluticasone [facility]) 50 MCG/ACT nasal spray Place 2 sprays into both nostrils daily.    [provider]  folic acid (FOLVITE) 1 MG tablet Take 1 mg by mouth daily.    [provider]  gabapentin (NEURONTIN) 600 MG tablet Take 600 mg by mouth 2 (two) times daily.    [provider]  metFORMIN (GLUCOPHAGE-XR) 500 MG 24 hr tablet Take 500 mg by mouth at bedtime.    [provider]  methadone (DOLOPHINE) 10 MG tablet Take 10 mg by mouth every 8 (eight) hours as needed.    [provider]  methotrexate (RHEUMATREX) 2.5 MG tablet Take 2.5  mg by mouth once a week. Caution:Chemotherapy. Protect from light.Take 5 tablets by mouth in the morning and 5 tablets in the evening once per week    [provider]  ondansetron (ZOFRAN-ODT) 4 MG disintegrating tablet Take 1 tablet (4 mg total) by mouth every 8 (eight) hours as needed for nausea or vomiting. 10/30/22   Sharman Cheek, MD  oxyCODONE (OXYCONTIN) 15 mg 12 hr tablet Take 15 mg by mouth every 12 (twelve) hours as needed.    [provider]  pantoprazole (PROTONIX) 40 MG tablet Take 40 mg by mouth daily. 01/30/21   [provider]  predniSONE (DELTASONE) 2.5 MG tablet Take 2.5 mg by mouth daily with breakfast.    [provider]  prochlorperazine (COMPAZINE) 5 MG tablet Take 1 tablet (5 mg total) by mouth every 6 (six) hours as needed for nausea or vomiting (or headache). 03/27/22   Shaune Pollack, MD  QUEtiapine (SEROQUEL) 100 MG tablet Take 100 mg by mouth at bedtime.    [provider]  Tofacitinib Citrate ER 11 MG TB24 Take by mouth.    [provider]  traZODone (DESYREL) 50 MG tablet Take 50 mg by mouth at bedtime. 02/12/21   [provider]    Physical Exam: Vitals:   06/11/23 1630 06/11/23 1700 06/11/23 1730 06/11/23 1800  BP: (!) 159/68 (!) 152/67 (!) 174/77 (!) 160/85  Pulse: 73 68 76 65  Resp: 13 12 15 12   Temp:      TempSrc:      SpO2: 95% 98% 98% 98%   Constitutional: appears age-appropriate, NAD, calm Eyes: PERRL, lids and conjunctivae normal ENMT: Mucous membranes are moist. Posterior pharynx clear of any exudate or lesions. Age-appropriate dentition. Hearing appropriate Neck: normal, supple, no masses, no thyromegaly Respiratory: clear to auscultation bilaterally, no wheezing, no crackles. Normal respiratory effort. No accessory muscle use.  Cardiovascular: Regular rate and rhythm, no murmurs / rubs / gallops. No extremity edema. 2+ pedal pulses. No carotid bruits.  Abdomen: Obese abdomen, no  tenderness, no masses palpated, no hepatosplenomegaly. Bowel sounds positive.  Musculoskeletal: no clubbing / cyanosis. No joint deformity upper and lower extremities. Good ROM, no contractures, no atrophy. Normal muscle tone.  Skin: no rashes, lesions, ulcers. No induration Neurologic: Sensation intact. Strength 5/5 in all 4.  Psychiatric: Normal judgment and insight. Alert and oriented x 3. Normal mood.   EKG: Not indicated at this time  Chest x-ray on Admission: Left hip x-ray ordered pending completion and read  CT ABDOMEN PELVIS W CONTRAST  Result Date: 06/11/2023 CLINICAL DATA:  Left lower quadrant abdominal  pain. EXAM: CT ABDOMEN AND PELVIS WITH CONTRAST TECHNIQUE: Multidetector CT imaging of the abdomen and pelvis was performed using the standard protocol following bolus administration of intravenous contrast. RADIATION DOSE REDUCTION: This exam was performed according to the departmental dose-optimization program which includes automated exposure control, adjustment of the mA and/or kV according to patient size and/or use of iterative reconstruction technique. CONTRAST:  OMNIPAQUE IOHEXOL 300 MG/ML  SOLN COMPARISON:  October 30, 2022 FINDINGS: Lower chest: No acute abnormality. Hepatobiliary: No focal liver abnormality is seen. Status post cholecystectomy. No biliary dilatation. Pancreas: Post distal pancreatectomy. Spleen: Normal in size without focal abnormality. Adrenals/Urinary Tract: Large exophytic benign cyst off of the upper pole of the right kidney measures 3.6 cm. Otherwise no significant findings within the kidneys or adrenal glands. Normal urinary bladder. Stomach/Bowel: Stomach is within normal limits. Appendix appears normal. No evidence of bowel wall thickening, distention, or inflammatory changes. Vascular/Lymphatic: Advanced atherosclerotic disease of the aorta and its branches with calcified and noncalcified plaque. 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 Reproductive: Status post hysterectomy. No adnexal masses. Other: Fat containing anterior abdominal wall hernia. Musculoskeletal: No acute or significant osseous findings. L4-L5 posterior fusion with intact hardware. IMPRESSION: 1. No acute findings within the abdomen or pelvis. Stable changes of distal pancreatectomy. 2. Advanced atherosclerotic disease of the aorta and its branches with calcified and noncalcified plaque. 3. 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 Aortic Atherosclerosis (ICD10-I70.0). Electronically Signed   By: Ted Mcalpine M.D.   On: 06/11/2023 13:43    Labs on Admission: I have personally reviewed following labs  CBC: Recent Labs  Lab 06/11/23 0949  WBC 8.3  HGB 12.6  HCT 40.2  MCV 96.4  PLT 267   Basic Metabolic Panel: Recent Labs  Lab 06/11/23 0949  NA 139  K 3.9  CL 105  CO2 25  GLUCOSE 187*  BUN 14  CREATININE 0.84  CALCIUM 9.1   GFR: CrCl cannot be calculated (Unknown ideal weight.).  Liver Function Tests: Recent Labs  Lab 06/11/23 0949  AST 22  ALT 19  ALKPHOS 87  BILITOT 0.6  PROT 6.5  ALBUMIN 3.5   Recent Labs  Lab 06/11/23 0949  LIPASE 27   Urine analysis:    Component Value Date/Time   COLORURINE YELLOW (A) 06/11/2023 1303   APPEARANCEUR HAZY (A) 06/11/2023 1303   LABSPEC 1.012 06/11/2023 1303   PHURINE 5.0 06/11/2023 1303   GLUCOSEU 50 (A) 06/11/2023 1303   HGBUR NEGATIVE 06/11/2023 1303   BILIRUBINUR NEGATIVE 06/11/2023 1303   KETONESUR NEGATIVE 06/11/2023 1303   PROTEINUR NEGATIVE 06/11/2023 1303   NITRITE NEGATIVE 06/11/2023 1303   LEUKOCYTESUR NEGATIVE 06/11/2023 1303   This document was prepared using Dragon Voice Recognition software and may include unintentional dictation errors.  Dr. Sedalia Muta [facility]  If 7PM-7AM, please contact overnight-coverage provider If 7AM-7PM,  please contact day attending provider [email or web address]  06/11/2023, 6:54 PM

## 2023-06-11 NOTE — ED Notes (Signed)
Iv team currently at bedside

## 2023-06-11 NOTE — ED Triage Notes (Signed)
Pt c/o lower abdominal/groin pain for the past few days. Pt has had nausea.

## 2023-06-11 NOTE — ED Notes (Signed)
Currently unable to gain Iv access on pt. Iv team consult placed

## 2023-06-11 NOTE — Assessment & Plan Note (Addendum)
With radiation to left flank, left posterior hip Etiology workup in progress query exacerbation of rheumatoid arthritis flare While the pain is consistent with a single dermatome, however patient symptoms is not consistent with shingles And I do not appreciate any rash in her left lower quadrant abdominal to left flank and left hip/back Left hip x-ray 3 views ordered Symptomatic support: Oxycodone 0.5 mg IV every 4 hours as needed for moderate pain, 20 hours of coverage ordered; fentanyl 50 mcg IV every 4 hours as needed for severe pain, 20 hours of coverage ordered; Dilaudid 1 mg IV every 4 hours as needed for severe pain not responsive to IV Dilaudid 0.5 mg or fentanyl 50 mcg IV, 20 hours of coverage ordered

## 2023-06-11 NOTE — Assessment & Plan Note (Signed)
Home metformin not resumed on admission Insulin SSI with at bedtime coverage ordered Goal inpatient blood glucose levels 140-180

## 2023-06-12 DIAGNOSIS — R52 Pain, unspecified: Secondary | ICD-10-CM

## 2023-06-12 LAB — CBC
HCT: 35.6 % — ABNORMAL LOW (ref 36.0–46.0)
Hemoglobin: 11.2 g/dL — ABNORMAL LOW (ref 12.0–15.0)
MCH: 30.2 pg (ref 26.0–34.0)
MCHC: 31.5 g/dL (ref 30.0–36.0)
MCV: 96 fL (ref 80.0–100.0)
Platelets: 216 10*3/uL (ref 150–400)
RBC: 3.71 MIL/uL — ABNORMAL LOW (ref 3.87–5.11)
RDW: 15.3 % (ref 11.5–15.5)
WBC: 7.4 10*3/uL (ref 4.0–10.5)
nRBC: 0 % (ref 0.0–0.2)

## 2023-06-12 LAB — BASIC METABOLIC PANEL
Anion gap: 8 (ref 5–15)
BUN: 13 mg/dL (ref 8–23)
CO2: 26 mmol/L (ref 22–32)
Calcium: 8.8 mg/dL — ABNORMAL LOW (ref 8.9–10.3)
Chloride: 105 mmol/L (ref 98–111)
Creatinine, Ser: 0.86 mg/dL (ref 0.44–1.00)
GFR, Estimated: >60 mL/min (ref >60)
Glucose, Bld: 157 mg/dL — ABNORMAL HIGH (ref 70–99)
Potassium: 3.6 mmol/L (ref 3.5–5.1)
Sodium: 139 mmol/L (ref 135–145)

## 2023-06-12 LAB — GLUCOSE, CAPILLARY
Glucose-Capillary: 157 mg/dL — ABNORMAL HIGH (ref 70–99)
Glucose-Capillary: 149 mg/dL — ABNORMAL HIGH (ref 70–99)
Glucose-Capillary: 158 mg/dL — ABNORMAL HIGH (ref 70–99)
Glucose-Capillary: 138 mg/dL — ABNORMAL HIGH (ref 70–99)

## 2023-06-12 LAB — HEMOGLOBIN A1C
Hgb A1c MFr Bld: 7.8 % — ABNORMAL HIGH (ref 4.8–5.6)
Mean Plasma Glucose: 177.16 mg/dL

## 2023-06-12 MED ORDER — IOHEXOL 300 MG/ML  SOLN: 100.0000 mL | Freq: Once | INTRAMUSCULAR | Status: DC | PRN

## 2023-06-12 MED ORDER — METHADONE HCL 10 MG PO TABS
10.0000 mg | ORAL_TABLET | Freq: Two times a day (BID) | ORAL | Status: DC
  Administered 2023-06-12 – 2023-06-13 (×2): 10 mg via ORAL
  Filled 2023-06-12 (×2): qty 1

## 2023-06-12 MED ORDER — HYDROMORPHONE HCL 1 MG/ML IJ SOLN
0.5000 mg | INTRAMUSCULAR | Status: DC | PRN
  Administered 2023-06-12 – 2023-06-13 (×4): 0.5 mg via INTRAVENOUS
  Filled 2023-06-12 (×4): qty 0.5

## 2023-06-12 MED ORDER — VALACYCLOVIR HCL 500 MG PO TABS
1000.0000 mg | ORAL_TABLET | Freq: Three times a day (TID) | ORAL | Status: DC
  Administered 2023-06-12 – 2023-06-13 (×3): 1000 mg via ORAL
  Filled 2023-06-12 (×4): qty 2

## 2023-06-12 MED ORDER — HYDROMORPHONE HCL 1 MG/ML IJ SOLN: 0.5000 mg | INTRAMUSCULAR | Status: AC | PRN

## 2023-06-12 MED ORDER — OXYCODONE HCL 5 MG PO TABS
10.0000 mg | ORAL_TABLET | ORAL | Status: DC
  Administered 2023-06-12 – 2023-06-13 (×6): 10 mg via ORAL
  Filled 2023-06-12 (×6): qty 2

## 2023-06-12 MED ORDER — ACETAMINOPHEN 500 MG PO TABS
500.0000 mg | ORAL_TABLET | ORAL | Status: DC
  Administered 2023-06-12 – 2023-06-13 (×6): 500 mg via ORAL
  Filled 2023-06-12 (×6): qty 1

## 2023-06-12 NOTE — Progress Notes (Signed)
PROGRESS NOTE    Betty Ferguson   UVO:[id number] DOB: 1961/05/09  DOA: 06/11/2023 Date of Service: 06/12/23 PCP: Shawn Stall, MD     Brief Narrative / Hospital Course:  Ms. Betty Ferguson is a 62 year old female with history of rheumatoid arthritis, history of pancreatic and liver cancer status post surgical resection, aortic atherosclerosis, non-insulin-dependent diabetes mellitus, who presents to the emergency department for chief concerns of nausea and abdominal pain x1 day. She states that she vomited 2 times today at home and it was white sputum. She reports yesterday she had some bile in her vomitus. She denies coffee-ground, black vomitus and black stool. She reports she had a regular bowel movement 2 days ago and it was brown in color. She reports that she does not have a bowel movement every day.  09/07: VSS, mild hypertension, mild hyperglycemia no other concerns on labs including UA, CT abd/pelv no acute findings, stable changes of distal pancreatectomy. XR hip/pelvis negative. ED treatment: Gabapentin 300 mg p.o. one-time dose, Dilaudid 0.5 mg IV x 2 doses, Dilaudid 1 mg IV x 2 doses, ondansetron 4 mg IV 2 doses were given, sodium chloride 500 mL bolus. 09/08: PDMP reviewed --> on Oxycodone-APAP 10-325 #180 x30d last filled 06/01/23 + Methadone 10 mg #60 x30d last filled 05/30/23. Also occasional Rx for gabapentin and clonazepam. Backing down on pain Rx - starting oxycodone 10 mg po q4h (home equivalent) w/ dilaudid IV for breakthrough. She's concerned about shingles but exam seems more c//w hip joint pathology, will give antivirals just in case   Consultants:  none  Procedures: none      ASSESSMENT & PLAN:   Principal Problem:   Inadequate pain control Active Problems:   Left lower quadrant abdominal pain   Rheumatoid arthritis (HCC)   Aortic atherosclerosis (HCC)   Hyperlipidemia   Diabetes mellitus type 2, noninsulin dependent (HCC)   GERD (gastroesophageal reflux  disease)   Chronic pain   Cyst of right kidney   Abdominal wall hernia  Inadequate pain control Left lower quadrant abdominal pain With radiation to left flank, left posterior hip Exam c/w hip joint pathology  Imaging no cause noted  Etiology workup in progress query exacerbation of rheumatoid arthritis flare Not consistent with shingles but will trial antivirals given pt concerns  Oxycodone 10 mg po q4h (home equivalent) + methadone 10 mg bid   Chronic pain Fibromyalgia PDMP reviewed --> on Oxycodone-APAP 10-325 #180 x30d last filled 06/01/23 + Methadone 10 mg #60 x30d last filled 05/30/23. Also occasional Rx for gabapentin and clonazepam   Abdominal wall hernia Fat-containing, informed patient of this finding monitor outpatient   Cyst of right kidney Read as large exophytic benign cyst Informed patient of this incidental finding outpatient monitor   Diabetes mellitus type 2, noninsulin dependent (HCC) Home metformin not resumed on admission Insulin SSI with at bedtime coverage ordered Goal inpatient blood glucose levels 140-180       obesity based on BMI: Body mass index is 35.81 kg/m.    DVT prophylaxis: lovenox IV fluids: no continuous IV fluids  Nutrition: carb modified Central lines / invasive devices: none  Code Status: FULL CODE ACP documentation reviewed:  none on file in [facility]  Current Admission Status: observation  LOS: 0 TOC needs: none at this time Barriers to discharge / significant pending items: pain control             Subjective / Brief ROS:  Patient reports pain in L groin/hip area  Denies  CP/SOB.  Pain controlled.  Denies new weakness.  Tolerating diet.  Reports no concerns w/ urination/defecation.   Family Communication: family at bedside on rounds     Objective Findings:  Vitals:   06/12/23 0500 06/12/23 0550 06/12/23 0807 06/12/23 1152  BP:  (!) 156/70 138/68 120/62  Pulse:  86 89 79  Resp:  20 18 16   Temp:   99.5 F (37.5 C) 98.7 F (37.1 C) 97.8 F (36.6 C)  TempSrc:  Oral    SpO2:  98% 96% 100%  Weight: 88.8 kg     Height: 5\' 2"  (1.575 m)       Intake/Output Summary (Last 24 hours) at 06/12/2023 1529 Last data filed at 06/12/2023 1512 Gross per 24 hour  Intake 1830.15 ml  Output --  Net 1830.15 ml   Filed Weights   06/12/23 0500  Weight: 88.8 kg    Examination:  Physical Exam Constitutional:      General: She is not in acute distress.    Appearance: She is not ill-appearing.  Cardiovascular:     Rate and Rhythm: Normal rate and regular rhythm.  Pulmonary:     Effort: Pulmonary effort is normal.     Breath sounds: Normal breath sounds.  Abdominal:     General: Bowel sounds are normal.     Palpations: Abdomen is soft.  Musculoskeletal:     Left hip: Decreased range of motion ((+)log roll internal rotation and w/ flexion/internal rotation, (+)tenderness at greater trochanter).  Skin:    General: Skin is warm and dry.  Neurological:     General: No focal deficit present.     Mental Status: She is alert and oriented to person, place, and time.  Psychiatric:        Mood and Affect: Mood normal.        Behavior: Behavior normal.          Scheduled Medications:   acetaminophen  500 mg Oral Q4H   aspirin  81 mg Oral Daily   atorvastatin  40 mg Oral QHS   docusate sodium  100 mg Oral Daily   enoxaparin (LOVENOX) injection  40 mg Subcutaneous Q24H   famotidine  20 mg Oral BID   folic acid  1 mg Oral Daily   gabapentin  600 mg Oral BID   insulin aspart  0-15 Units Subcutaneous TID WC   insulin aspart  0-5 Units Subcutaneous QHS   lidocaine  1 patch Transdermal Q24H   methadone  10 mg Oral Q12H   oxyCODONE  10 mg Oral Q4H   pantoprazole  40 mg Oral Daily   predniSONE  2.5 mg Oral Q breakfast   QUEtiapine  100 mg Oral QHS   traZODone  50 mg Oral QHS   valACYclovir  1,000 mg Oral TID    Continuous Infusions:  promethazine (PHENERGAN) injection (IM or IVPB)       PRN Medications:  clonazePAM, cyclobenzaprine, fluticasone, iohexol, ondansetron **OR** ondansetron (ZOFRAN) IV, promethazine (PHENERGAN) injection (IM or IVPB), senna-docusate  Antimicrobials from admission:  Anti-infectives (From admission, onward)    Start     Dose/Rate Route Frequency Ordered Stop   06/12/23 1600  valACYclovir (VALTREX) tablet 1,000 mg        1,000 mg Oral 3 times daily 06/12/23 1245 06/15/23 1559           Data Reviewed:  I have personally reviewed the following...  CBC: Recent Labs  Lab 06/11/23 0949 06/12/23 0255  WBC 8.3 7.4  HGB 12.6 11.2*  HCT 40.2 35.6*  MCV 96.4 96.0  PLT 267 216   Basic Metabolic Panel: Recent Labs  Lab 06/11/23 0949 06/12/23 0255  NA 139 139  K 3.9 3.6  CL 105 105  CO2 25 26  GLUCOSE 187* 157*  BUN 14 13  CREATININE 0.84 0.86  CALCIUM 9.1 8.8*   GFR: Estimated Creatinine Clearance: 70.2 mL/min (by C-G formula based on SCr of 0.86 mg/dL). Liver Function Tests: Recent Labs  Lab 06/11/23 0949  AST 22  ALT 19  ALKPHOS 87  BILITOT 0.6  PROT 6.5  ALBUMIN 3.5   Recent Labs  Lab 06/11/23 0949  LIPASE 27   No results for input(s): "AMMONIA" in the last 168 hours. Coagulation Profile: No results for input(s): "INR", "PROTIME" in the last 168 hours. Cardiac Enzymes: No results for input(s): "CKTOTAL", "CKMB", "CKMBINDEX", "TROPONINI" in the last 168 hours. BNP (last 3 results) No results for input(s): "PROBNP" in the last 8760 hours. HbA1C: Recent Labs    06/11/23 0949  HGBA1C 7.8*   CBG: Recent Labs  Lab 06/11/23 2128 06/12/23 0807 06/12/23 1153  GLUCAP 203* 157* 149*   Lipid Profile: No results for input(s): "CHOL", "HDL", "LDLCALC", "TRIG", "CHOLHDL", "LDLDIRECT" in the last 72 hours. Thyroid Function Tests: No results for input(s): "TSH", "T4TOTAL", "FREET4", "T3FREE", "THYROIDAB" in the last 72 hours. Anemia Panel: No results for input(s): "VITAMINB12", "FOLATE", "FERRITIN",  "TIBC", "IRON", "RETICCTPCT" in the last 72 hours. Most Recent Urinalysis On File:     Component Value Date/Time   COLORURINE YELLOW (A) 06/11/2023 1303   APPEARANCEUR HAZY (A) 06/11/2023 1303   LABSPEC 1.012 06/11/2023 1303   PHURINE 5.0 06/11/2023 1303   GLUCOSEU 50 (A) 06/11/2023 1303   HGBUR NEGATIVE 06/11/2023 1303   BILIRUBINUR NEGATIVE 06/11/2023 1303   KETONESUR NEGATIVE 06/11/2023 1303   PROTEINUR NEGATIVE 06/11/2023 1303   NITRITE NEGATIVE 06/11/2023 1303   LEUKOCYTESUR NEGATIVE 06/11/2023 1303   Sepsis Labs: @LABRCNTIP (procalcitonin:4,lacticidven:4) Microbiology: No results found for this or any previous visit (from the past 240 hour(s)).    Radiology Studies last 3 days: DG HIP UNILAT WITH PELVIS 2-3 VIEWS LEFT  Result Date: 06/11/2023 CLINICAL DATA:  Left-sided hip pain EXAM: DG HIP (WITH OR WITHOUT PELVIS) 2-3V LEFT COMPARISON:  CT 06/11/2023 FINDINGS: Fusion hardware in the lower lumbar spine. Contrast in the urinary bladder. SI joints are non widened. Pubic symphysis and rami appear intact. No fracture or malalignment. The joint space is patent. IMPRESSION: Negative. Electronically Signed   By: Jasmine Pang M.D.   On: 06/11/2023 19:53   CT ABDOMEN PELVIS W CONTRAST  Result Date: 06/11/2023 CLINICAL DATA:  Left lower quadrant abdominal pain. EXAM: CT ABDOMEN AND PELVIS WITH CONTRAST TECHNIQUE: Multidetector CT imaging of the abdomen and pelvis was performed using the standard protocol following bolus administration of intravenous contrast. RADIATION DOSE REDUCTION: This exam was performed according to the departmental dose-optimization program which includes automated exposure control, adjustment of the mA and/or kV according to patient size and/or use of iterative reconstruction technique. CONTRAST:  OMNIPAQUE IOHEXOL 300 MG/ML  SOLN COMPARISON:  October 30, 2022 FINDINGS: Lower chest: No acute abnormality. Hepatobiliary: No focal liver abnormality is seen. Status  post cholecystectomy. No biliary dilatation. Pancreas: Post distal pancreatectomy. Spleen: Normal in size without focal abnormality. Adrenals/Urinary Tract: Large exophytic benign cyst off of the upper pole of the right kidney measures 3.6 cm. Otherwise no significant findings within the kidneys or adrenal glands. Normal urinary bladder. Stomach/Bowel: Stomach  is within normal limits. Appendix appears normal. No evidence of bowel wall thickening, distention, or inflammatory changes. Vascular/Lymphatic: Advanced atherosclerotic disease of the aorta and its branches with calcified and noncalcified plaque. 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 Reproductive: Status post hysterectomy. No adnexal masses. Other: Fat containing anterior abdominal wall hernia. Musculoskeletal: No acute or significant osseous findings. L4-L5 posterior fusion with intact hardware. IMPRESSION: 1. No acute findings within the abdomen or pelvis. Stable changes of distal pancreatectomy. 2. Advanced atherosclerotic disease of the aorta and its branches with calcified and noncalcified plaque. 3. 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 Aortic Atherosclerosis (ICD10-I70.0). Electronically Signed   By: Ted Mcalpine M.D.   On: 06/11/2023 13:43             LOS: 0 days        Sunnie Nielsen, DO [facility] 06/12/2023, 3:29 PM    Dictation software may have been used to generate the above note. Typos may occur and escape review in typed/dictated notes. Please contact Dr Lyn Hollingshead directly for clarity if needed.  Staff may message me via secure chat in Epic  but this may not receive an immediate response,  please page me for urgent matters!  If 7PM-7AM, please contact night coverage [email or web address]

## 2023-06-12 NOTE — Care Management Obs Status (Signed)
MEDICARE OBSERVATION STATUS NOTIFICATION   Patient Details  Name: Betty Ferguson MRN: [id number] Date of Birth: 1961/04/12   Medicare Observation Status Notification Given:  Yes    [name] [name], LCSW 06/12/2023, 3:50 PM

## 2023-06-13 DIAGNOSIS — Z881 Allergy status to other antibiotic agents status: Secondary | ICD-10-CM | POA: Diagnosis not present

## 2023-06-13 DIAGNOSIS — Z79899 Other long term (current) drug therapy: Secondary | ICD-10-CM | POA: Diagnosis not present

## 2023-06-13 DIAGNOSIS — M069 Rheumatoid arthritis, unspecified: Secondary | ICD-10-CM | POA: Diagnosis present

## 2023-06-13 DIAGNOSIS — N281 Cyst of kidney, acquired: Secondary | ICD-10-CM | POA: Diagnosis present

## 2023-06-13 DIAGNOSIS — Z7982 Long term (current) use of aspirin: Secondary | ICD-10-CM | POA: Diagnosis not present

## 2023-06-13 DIAGNOSIS — I719 Aortic aneurysm of unspecified site, without rupture: Secondary | ICD-10-CM | POA: Diagnosis present

## 2023-06-13 DIAGNOSIS — I10 Essential (primary) hypertension: Secondary | ICD-10-CM | POA: Diagnosis present

## 2023-06-13 DIAGNOSIS — Z8505 Personal history of malignant neoplasm of liver: Secondary | ICD-10-CM | POA: Diagnosis not present

## 2023-06-13 DIAGNOSIS — K219 Gastro-esophageal reflux disease without esophagitis: Secondary | ICD-10-CM | POA: Diagnosis present

## 2023-06-13 DIAGNOSIS — E669 Obesity, unspecified: Secondary | ICD-10-CM | POA: Diagnosis present

## 2023-06-13 DIAGNOSIS — Z87891 Personal history of nicotine dependence: Secondary | ICD-10-CM | POA: Diagnosis not present

## 2023-06-13 DIAGNOSIS — Z8249 Family history of ischemic heart disease and other diseases of the circulatory system: Secondary | ICD-10-CM | POA: Diagnosis not present

## 2023-06-13 DIAGNOSIS — Z23 Encounter for immunization: Secondary | ICD-10-CM | POA: Diagnosis present

## 2023-06-13 DIAGNOSIS — E1165 Type 2 diabetes mellitus with hyperglycemia: Secondary | ICD-10-CM | POA: Diagnosis present

## 2023-06-13 DIAGNOSIS — I700 Atherosclerosis of aorta: Secondary | ICD-10-CM | POA: Diagnosis present

## 2023-06-13 DIAGNOSIS — E785 Hyperlipidemia, unspecified: Secondary | ICD-10-CM | POA: Diagnosis present

## 2023-06-13 DIAGNOSIS — M797 Fibromyalgia: Secondary | ICD-10-CM | POA: Diagnosis present

## 2023-06-13 DIAGNOSIS — R1032 Left lower quadrant pain: Secondary | ICD-10-CM | POA: Diagnosis present

## 2023-06-13 DIAGNOSIS — K439 Ventral hernia without obstruction or gangrene: Secondary | ICD-10-CM | POA: Diagnosis present

## 2023-06-13 DIAGNOSIS — R52 Pain, unspecified: Secondary | ICD-10-CM | POA: Diagnosis not present

## 2023-06-13 DIAGNOSIS — M329 Systemic lupus erythematosus, unspecified: Secondary | ICD-10-CM | POA: Diagnosis present

## 2023-06-13 DIAGNOSIS — Z6835 Body mass index (BMI) 35.0-35.9, adult: Secondary | ICD-10-CM | POA: Diagnosis not present

## 2023-06-13 DIAGNOSIS — Z7984 Long term (current) use of oral hypoglycemic drugs: Secondary | ICD-10-CM | POA: Diagnosis not present

## 2023-06-13 DIAGNOSIS — Z8507 Personal history of malignant neoplasm of pancreas: Secondary | ICD-10-CM | POA: Diagnosis not present

## 2023-06-13 DIAGNOSIS — G8929 Other chronic pain: Secondary | ICD-10-CM | POA: Diagnosis present

## 2023-06-13 LAB — GLUCOSE, CAPILLARY: Glucose-Capillary: 165 mg/dL — ABNORMAL HIGH (ref 70–99)

## 2023-06-13 MED ORDER — OXYCODONE HCL 5 MG PO TABS: 5.0000 mg | ORAL_TABLET | ORAL | 0 refills | Status: AC | PRN

## 2023-06-13 MED ORDER — OXYCODONE HCL 5 MG PO TABS
5.0000 mg | ORAL_TABLET | ORAL | Status: DC | PRN
  Administered 2023-06-13: 5 mg via ORAL
  Filled 2023-06-13: qty 1

## 2023-06-13 MED ORDER — VALACYCLOVIR HCL 1 G PO TABS: 1000.0000 mg | ORAL_TABLET | Freq: Three times a day (TID) | ORAL | 0 refills | Status: AC

## 2023-06-13 MED ORDER — PREDNISONE 10 MG PO TABS: 30.0000 mg | ORAL_TABLET | Freq: Every day | ORAL | 0 refills | Status: AC

## 2023-06-13 NOTE — Discharge Summary (Signed)
Physician Discharge Summary   Patient: Betty Ferguson MRN: [id number]  DOB: June 12, 1961   Admit:     Date of Admission: 06/11/2023 Admitted from: home   Discharge: Date of discharge: 06/13/23 Disposition: Home Condition at discharge: good  CODE STATUS: FULL CODE     Discharge Physician: Sunnie Nielsen, DO [facility]     PCP: Shawn Stall, MD  Recommendations for Outpatient Follow-up:  Follow up with PCP Shawn Stall, MD in 1-2 weeks Follow w/ pain management, may need ortho referral or pain management follow up for hip Follow up on aortic aneurysm and renal cyst  Please obtain labs/tests: as needed Please follow up on the following pending results: none PCP AND OTHER OUTPATIENT PROVIDERS: SEE BELOW FOR SPECIFIC DISCHARGE INSTRUCTIONS PRINTED FOR PATIENT IN ADDITION TO GENERIC AVS PATIENT INFO     Discharge Instructions     Ambulatory referral to Vascular Surgery   Complete by: As directed    aaa   Diet Carb Modified   Complete by: As directed    Discharge instructions   Complete by: As directed    I suspect hip/groin/back pain is more related to hip joint as opposed to shingles. Will cover for shingles just in case with antiviral medication, given that you do have some immune compromise with the chronic steroid use and the RA. Will also do a steroid burst which should help pain in addition to your usual medications. Please follow up with your primary care office and they can arrange a referral to orthopedics or sports medicine.  Please call you pain management clinic and let them know you were given a Rx by the hospital   Increase activity slowly   Complete by: As directed          Discharge Diagnoses: Principal Problem:   Inadequate pain control Active Problems:   Left lower quadrant abdominal pain   Rheumatoid arthritis (HCC)   Aortic atherosclerosis (HCC)   Hyperlipidemia   Diabetes mellitus type 2, noninsulin dependent (HCC)   GERD  (gastroesophageal reflux disease)   Chronic pain   Cyst of right kidney   Abdominal wall hernia       Hospital Course: Betty Ferguson is a 62 year old female with history of rheumatoid arthritis, history of pancreatic and liver cancer status post surgical resection, aortic atherosclerosis, non-insulin-dependent diabetes mellitus, who presents to the emergency department for chief concerns of nausea and abdominal pain x1 day. She states that she vomited 2 times today at home and it was white sputum. She reports yesterday she had some bile in her vomitus. She denies coffee-ground, black vomitus and black stool. She reports she had a regular bowel movement 2 days ago and it was brown in color. She reports that she does not have a bowel movement every day.  09/07: VSS, mild hypertension, mild hyperglycemia no other concerns on labs including UA, CT abd/pelv no acute findings, stable changes of distal pancreatectomy. XR hip/pelvis negative. ED treatment: Gabapentin 300 mg p.o. one-time dose, Dilaudid 0.5 mg IV x 2 doses, Dilaudid 1 mg IV x 2 doses, ondansetron 4 mg IV 2 doses were given, sodium chloride 500 mL bolus. 09/08: PDMP reviewed --> on Oxycodone-APAP 10-325 #180 x30d last filled 06/01/23 + Methadone 10 mg #60 x30d last filled 05/30/23. Also occasional Rx for gabapentin and clonazepam. Backing down on pain Rx - starting oxycodone 10 mg po q4h (home equivalent) w/ dilaudid IV for breakthrough. She's concerned about shingles but exam seems more  c//w hip joint pathology, will give antivirals just in case   Consultants:  none  Procedures: none      ASSESSMENT & PLAN:   Inadequate pain control Left lower quadrant abdominal pain With radiation to left flank, left posterior hip Exam c/w hip joint pathology  Chronic pain Fibromyalgia Imaging no cause noted  Etiology workup in progress query exacerbation of rheumatoid arthritis flare Not consistent with shingles but will trial  antivirals given pt concerns  Oxycodone 10 mg po q4h (home equivalent) + new Rx for 5 mg po q4h prn can take in addition (pt advised call pain management to inform them of new Rx), methadone 10 mg bid  Abdominal wall hernia Fat-containing, informed patient of this finding monitor outpatient   Cyst of right kidney - large exophytic benign cyst Informed patient of this incidental finding Aortic aneurysm  outpatient monitor   Diabetes mellitus type 2, noninsulin dependent (HCC) Home metformin not resumed on admission Insulin SSI with at bedtime coverage ordered Goal inpatient blood glucose levels 140-180               Discharge Instructions  Allergies as of 06/13/2023       Reactions   Toradol [ketorolac Tromethamine] Itching   Doxycycline Hives   Morphine And Codeine         Medication List     STOP taking these medications    metFORMIN 500 MG 24 hr tablet Commonly known as: GLUCOPHAGE-XR   oxyCODONE 15 mg 12 hr tablet Commonly known as: OXYCONTIN Replaced by: oxyCODONE 5 MG immediate release tablet   prochlorperazine 5 MG tablet Commonly known as: COMPAZINE   traZODone 50 MG tablet Commonly known as: DESYREL       TAKE these medications    aspirin 81 MG chewable tablet Chew by mouth daily.   atorvastatin 40 MG tablet Commonly known as: LIPITOR Take 40 mg by mouth daily.   busPIRone 10 MG tablet Commonly known as: BUSPAR Take 10 mg by mouth 2 (two) times daily.   cetirizine 10 MG tablet Commonly known as: ZYRTEC Take 10 mg by mouth daily.   clonazePAM 0.5 MG tablet Commonly known as: KLONOPIN Take 0.5 mg by mouth at bedtime as needed for anxiety.   cyclobenzaprine 10 MG tablet Commonly known as: FLEXERIL Take 1 tablet (10 mg total) by mouth 3 (three) times daily as needed for muscle spasms.   docusate sodium 100 MG capsule Commonly known as: COLACE Take 100 mg by mouth daily as needed for mild constipation.   ergocalciferol 1.25  MG (50000 UT) capsule Commonly known as: VITAMIN D2 Take 50,000 Units by mouth once a week.   famotidine 20 MG tablet Commonly known as: PEPCID Take 1 tablet (20 mg total) by mouth 2 (two) times daily.   fluticasone 50 MCG/ACT nasal spray Commonly known as: FLONASE Place 2 sprays into both nostrils daily.   folic acid 1 MG tablet Commonly known as: FOLVITE Take 1 mg by mouth daily.   gabapentin 600 MG tablet Commonly known as: NEURONTIN Take 600 mg by mouth 2 (two) times daily.   ibuprofen 800 MG tablet Commonly known as: ADVIL Take 800 mg by mouth every 8 (eight) hours as needed for mild pain.   methadone 10 MG tablet Commonly known as: DOLOPHINE Take 10 mg by mouth 2 (two) times daily.   methotrexate 2.5 MG tablet Commonly known as: RHEUMATREX Take 2.5 mg by mouth once a week. Caution:Chemotherapy. Protect from light.Take 5 tablets by  mouth in the morning and 5 tablets in the evening once per week   naloxone 4 MG/0.1ML Liqd nasal spray kit Commonly known as: NARCAN Place 1 spray into the nose once.   ondansetron 4 MG disintegrating tablet Commonly known as: ZOFRAN-ODT Take 1 tablet (4 mg total) by mouth every 8 (eight) hours as needed for nausea or vomiting.   oxyCODONE 5 MG immediate release tablet Commonly known as: Oxy IR/ROXICODONE Take 1 tablet (5 mg total) by mouth every 4 (four) hours as needed for breakthrough pain (can give in addition to / at same time as oxycodone 10 mg po). Replaces: oxyCODONE 15 mg 12 hr tablet   oxyCODONE-acetaminophen 10-325 MG tablet Commonly known as: PERCOCET Take 1 tablet by mouth every 4 (four) hours as needed.   pantoprazole 40 MG tablet Commonly known as: PROTONIX Take 40 mg by mouth daily.   predniSONE 2.5 MG tablet Commonly known as: DELTASONE Take 2.5 mg by mouth daily with breakfast. What changed: Another medication with the same name was added. Make sure you understand how and when to take each.   predniSONE 10 MG  tablet Commonly known as: DELTASONE Take 3 tablets (30 mg total) by mouth daily for 5 days. Can take in addition to usual prednisone chronic dose then can continue usual prednisone dose after this Rx is complete Start taking on: June 14, 2023 What changed: You were already taking a medication with the same name, and this prescription was added. Make sure you understand how and when to take each.   QUEtiapine 100 MG tablet Commonly known as: SEROQUEL Take 100 mg by mouth at bedtime.   Semaglutide(0.25 or 0.5MG /DOS) 2 MG/3ML Sopn Inject 0.25-0.5 mg into the skin once a week. Inject 0.375 mLs (0.25 mg total) subcutaneously once a week X 4 weeks. Then increase to 0.5mg  weekly   Tofacitinib Citrate ER 11 MG Tb24 Take by mouth.   Trelegy Ellipta 200-62.5-25 MCG/ACT Aepb Generic drug: Fluticasone-Umeclidin-Vilant Inhale 1 puff into the lungs daily.   valACYclovir 1000 MG tablet Commonly known as: VALTREX Take 1 tablet (1,000 mg total) by mouth 3 (three) times daily for 6 days.          Allergies  Allergen Reactions   Toradol [Ketorolac Tromethamine] Itching   Doxycycline Hives   Morphine And Codeine      Subjective: pt reports pain is improved today, feels ok for discharge home    Discharge Exam: BP 115/62 (BP Location: Left Arm)   Pulse 78   Temp 98.2 F (36.8 C)   Resp 19   Ht 5\' 2"  (1.575 m)   Wt 88.8 kg   SpO2 96%   BMI 35.81 kg/m  General: Pt is alert, awake, not in acute distress Cardiovascular: RRR, S1/S2 +, no rubs, no gallops Respiratory: CTA bilaterally, no wheezing, no rhonchi Abdominal: Soft, NT, ND, bowel sounds + Extremities: no edema, no cyanosis     The results of significant diagnostics from this hospitalization (including imaging, microbiology, ancillary and laboratory) are listed below for reference.     Microbiology: No results found for this or any previous visit (from the past 240 hour(s)).   Labs: BNP (last 3 results) No  results for input(s): "BNP" in the last 8760 hours. Basic Metabolic Panel: Recent Labs  Lab 06/11/23 0949 06/12/23 0255  NA 139 139  K 3.9 3.6  CL 105 105  CO2 25 26  GLUCOSE 187* 157*  BUN 14 13  CREATININE 0.84 0.86  CALCIUM 9.1  8.8*   Liver Function Tests: Recent Labs  Lab 06/11/23 0949  AST 22  ALT 19  ALKPHOS 87  BILITOT 0.6  PROT 6.5  ALBUMIN 3.5   Recent Labs  Lab 06/11/23 0949  LIPASE 27   No results for input(s): "AMMONIA" in the last 168 hours. CBC: Recent Labs  Lab 06/11/23 0949 06/12/23 0255  WBC 8.3 7.4  HGB 12.6 11.2*  HCT 40.2 35.6*  MCV 96.4 96.0  PLT 267 216   Cardiac Enzymes: No results for input(s): "CKTOTAL", "CKMB", "CKMBINDEX", "TROPONINI" in the last 168 hours. BNP: Invalid input(s): "POCBNP" CBG: Recent Labs  Lab 06/12/23 0807 06/12/23 1153 06/12/23 1740 06/12/23 2210 06/13/23 0757  GLUCAP 157* 149* 158* 138* 165*   D-Dimer No results for input(s): "DDIMER" in the last 72 hours. Hgb A1c Recent Labs    06/11/23 0949  HGBA1C 7.8*   Lipid Profile No results for input(s): "CHOL", "HDL", "LDLCALC", "TRIG", "CHOLHDL", "LDLDIRECT" in the last 72 hours. Thyroid function studies No results for input(s): "TSH", "T4TOTAL", "T3FREE", "THYROIDAB" in the last 72 hours.  Invalid input(s): "FREET3" Anemia work up No results for input(s): "VITAMINB12", "FOLATE", "FERRITIN", "TIBC", "IRON", "RETICCTPCT" in the last 72 hours. Urinalysis    Component Value Date/Time   COLORURINE YELLOW (A) 06/11/2023 1303   APPEARANCEUR HAZY (A) 06/11/2023 1303   LABSPEC 1.012 06/11/2023 1303   PHURINE 5.0 06/11/2023 1303   GLUCOSEU 50 (A) 06/11/2023 1303   HGBUR NEGATIVE 06/11/2023 1303   BILIRUBINUR NEGATIVE 06/11/2023 1303   KETONESUR NEGATIVE 06/11/2023 1303   PROTEINUR NEGATIVE 06/11/2023 1303   NITRITE NEGATIVE 06/11/2023 1303   LEUKOCYTESUR NEGATIVE 06/11/2023 1303   Sepsis Labs Recent Labs  Lab 06/11/23 0949 06/12/23 0255  WBC  8.3 7.4   Microbiology No results found for this or any previous visit (from the past 240 hour(s)). Imaging DG HIP UNILAT WITH PELVIS 2-3 VIEWS LEFT  Result Date: 06/11/2023 CLINICAL DATA:  Left-sided hip pain EXAM: DG HIP (WITH OR WITHOUT PELVIS) 2-3V LEFT COMPARISON:  CT 06/11/2023 FINDINGS: Fusion hardware in the lower lumbar spine. Contrast in the urinary bladder. SI joints are non widened. Pubic symphysis and rami appear intact. No fracture or malalignment. The joint space is patent. IMPRESSION: Negative. Electronically Signed   By: Jasmine Pang M.D.   On: 06/11/2023 19:53   CT ABDOMEN PELVIS W CONTRAST  Result Date: 06/11/2023 CLINICAL DATA:  Left lower quadrant abdominal pain. EXAM: CT ABDOMEN AND PELVIS WITH CONTRAST TECHNIQUE: Multidetector CT imaging of the abdomen and pelvis was performed using the standard protocol following bolus administration of intravenous contrast. RADIATION DOSE REDUCTION: This exam was performed according to the departmental dose-optimization program which includes automated exposure control, adjustment of the mA and/or kV according to patient size and/or use of iterative reconstruction technique. CONTRAST:  OMNIPAQUE IOHEXOL 300 MG/ML  SOLN COMPARISON:  October 30, 2022 FINDINGS: Lower chest: No acute abnormality. Hepatobiliary: No focal liver abnormality is seen. Status post cholecystectomy. No biliary dilatation. Pancreas: Post distal pancreatectomy. Spleen: Normal in size without focal abnormality. Adrenals/Urinary Tract: Large exophytic benign cyst off of the upper pole of the right kidney measures 3.6 cm. Otherwise no significant findings within the kidneys or adrenal glands. Normal urinary bladder. Stomach/Bowel: Stomach is within normal limits. Appendix appears normal. No evidence of bowel wall thickening, distention, or inflammatory changes. Vascular/Lymphatic: Advanced atherosclerotic disease of the aorta and its branches with calcified and noncalcified  plaque. 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 Reproductive: Status post hysterectomy. No adnexal masses. Other: Fat containing anterior abdominal wall hernia. Musculoskeletal: No acute or significant osseous findings. L4-L5 posterior fusion with intact hardware. IMPRESSION: 1. No acute findings within the abdomen or pelvis. Stable changes of distal pancreatectomy. 2. Advanced atherosclerotic disease of the aorta and its branches with calcified and noncalcified plaque. 3. 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 Aortic Atherosclerosis (ICD10-I70.0). Electronically Signed   By: Ted Mcalpine M.D.   On: 06/11/2023 13:43      Time coordinating discharge: over 30 minutes  SIGNED:  Sunnie Nielsen DO [facility]

## 2023-06-13 NOTE — Group Note (Deleted)

## 2023-07-19 ENCOUNTER — Encounter (INDEPENDENT_AMBULATORY_CARE_PROVIDER_SITE_OTHER): Payer: 59 | Admitting: Vascular Surgery

## 2023-07-24 ENCOUNTER — Emergency Department
Admission: EM | Admit: 2023-07-24 | Discharge: 2023-07-24 | Disposition: A | Discharge status: Home / Self Care | Payer: 59 | Attending: Emergency Medicine | Admitting: Emergency Medicine

## 2023-07-24 ENCOUNTER — Other Ambulatory Visit: Payer: Self-pay

## 2023-07-24 DIAGNOSIS — E119 Type 2 diabetes mellitus without complications: Secondary | ICD-10-CM | POA: Diagnosis not present

## 2023-07-24 DIAGNOSIS — R1032 Left lower quadrant pain: Secondary | ICD-10-CM | POA: Diagnosis present

## 2023-07-24 LAB — URINALYSIS, ROUTINE W REFLEX MICROSCOPIC
Bilirubin Urine: NEGATIVE
Glucose, UA: NEGATIVE mg/dL
Hgb urine dipstick: NEGATIVE
Ketones, ur: NEGATIVE mg/dL
Leukocytes,Ua: NEGATIVE
Nitrite: NEGATIVE
Protein, ur: NEGATIVE mg/dL
Specific Gravity, Urine: 1.013 (ref 1.005–1.030)
pH: 5 (ref 5.0–8.0)

## 2023-07-24 LAB — CBC
HCT: 43.6 % (ref 36.0–46.0)
Hemoglobin: 13.5 g/dL (ref 12.0–15.0)
MCH: 30.3 pg (ref 26.0–34.0)
MCHC: 31 g/dL (ref 30.0–36.0)
MCV: 97.8 fL (ref 80.0–100.0)
Platelets: 168 10*3/uL (ref 150–400)
RBC: 4.46 MIL/uL (ref 3.87–5.11)
RDW: 15.3 % (ref 11.5–15.5)
WBC: 8.8 10*3/uL (ref 4.0–10.5)
nRBC: 0 % (ref 0.0–0.2)

## 2023-07-24 LAB — COMPREHENSIVE METABOLIC PANEL
ALT: 21 U/L (ref 0–44)
AST: 27 U/L (ref 15–41)
Albumin: 3.5 g/dL (ref 3.5–5.0)
Alkaline Phosphatase: 72 U/L (ref 38–126)
Anion gap: 11 (ref 5–15)
BUN: 12 mg/dL (ref 8–23)
CO2: 20 mmol/L — ABNORMAL LOW (ref 22–32)
Calcium: 8.7 mg/dL — ABNORMAL LOW (ref 8.9–10.3)
Chloride: 105 mmol/L (ref 98–111)
Creatinine, Ser: 0.82 mg/dL (ref 0.44–1.00)
GFR, Estimated: >60 mL/min (ref >60)
Glucose, Bld: 170 mg/dL — ABNORMAL HIGH (ref 70–99)
Potassium: 4 mmol/L (ref 3.5–5.1)
Sodium: 136 mmol/L (ref 135–145)
Total Bilirubin: 0.5 mg/dL (ref 0.3–1.2)
Total Protein: 6.3 g/dL — ABNORMAL LOW (ref 6.5–8.1)

## 2023-07-24 LAB — LIPASE, BLOOD: Lipase: 32 U/L (ref 11–51)

## 2023-07-24 MED ORDER — HYDROMORPHONE HCL 1 MG/ML IJ SOLN
1.0000 mg | Freq: Once | INTRAMUSCULAR | Status: AC
  Administered 2023-07-24: 1 mg via INTRAVENOUS
  Filled 2023-07-24: qty 1

## 2023-07-24 MED ORDER — SODIUM CHLORIDE 0.9 % IV BOLUS
500.0000 mL | Freq: Once | INTRAVENOUS | Status: AC
  Administered 2023-07-24: 500 mL via INTRAVENOUS

## 2023-07-24 MED ORDER — ONDANSETRON HCL 4 MG/2ML IJ SOLN
4.0000 mg | Freq: Once | INTRAMUSCULAR | Status: AC
  Administered 2023-07-24: 4 mg via INTRAVENOUS
  Filled 2023-07-24: qty 2

## 2023-07-24 NOTE — ED Triage Notes (Signed)
Pt comes with c/o lower back pain and some abdominal pain since Friday. Pt states no recent falls or injuries. Pt states she also has aneurysm to lower left abdominal toward pelvic area and that it was acting up to.  Pt denies any urinary symptoms. Pt states some nausea.

## 2023-07-24 NOTE — ED Provider Notes (Signed)
[facility] Provider Note    Event Date/Time   First MD Initiated Contact with Patient 07/24/23 1108     (approximate)   History   Abdominal Pain   HPI  Betty Ferguson is a 62 y.o. female with a history of fibromyalgia, diabetes, chronic pain who presents with left lower quadrant abdominal pain.  She has had this many times in the past.  She reports it is flared up over the last several days.  Review of record demonstrates the patient was admitted for this in September at our hospital on September 7.  CT scan performed at that time was reassuring, she does have a aortic saccular aneurysm which is chronic       Physical Exam   Triage Vital Signs: ED Triage Vitals  Encounter Vitals Group     BP 07/24/23 0902 131/75     Systolic BP Percentile --      Diastolic BP Percentile --      Pulse Rate 07/24/23 0902 72     Resp 07/24/23 0902 18     Temp 07/24/23 0902 98 F (36.7 C)     Temp src --      SpO2 07/24/23 0902 100 %     Weight 07/24/23 0902 76.7 kg (169 lb)     Height 07/24/23 0902 1.575 m (5\' 2" )     Head Circumference --      Peak Flow --      Pain Score 07/24/23 0901 10     Pain Loc --      Pain Education --      Exclude from Growth Chart --     Most recent vital signs: Vitals:   07/24/23 0902 07/24/23 1339  BP: 131/75 (!) 154/77  Pulse: 72 71  Resp: 18 18  Temp: 98 F (36.7 C)   SpO2: 100% 99%     General: Awake, no distress.  CV:  Good peripheral perfusion.  Resp:  Normal effort.  Abd:  No distention.  Mild tenderness left lower quadrant Other:     ED Results / Procedures / Treatments   Labs (all labs ordered are listed, but only abnormal results are displayed) Labs Reviewed  COMPREHENSIVE METABOLIC PANEL - Abnormal; Notable for the following components:      Result Value   CO2 20 (*)    Glucose, Bld 170 (*)    Calcium 8.7 (*)    Total Protein 6.3 (*)    All other components within normal limits  URINALYSIS,  ROUTINE W REFLEX MICROSCOPIC - Abnormal; Notable for the following components:   Color, Urine YELLOW (*)    APPearance HAZY (*)    All other components within normal limits  LIPASE, BLOOD  CBC     EKG     RADIOLOGY Reviewed CT from last month    PROCEDURES:  Critical Care performed:   Procedures   MEDICATIONS ORDERED IN ED: Medications  HYDROmorphone (DILAUDID) injection 1 mg (1 mg Intravenous Given 07/24/23 1200)  ondansetron (ZOFRAN) injection 4 mg (4 mg Intravenous Given 07/24/23 1159)  sodium chloride 0.9 % bolus 500 mL (0 mLs Intravenous Stopped 07/24/23 1230)  HYDROmorphone (DILAUDID) injection 1 mg (1 mg Intravenous Given 07/24/23 1339)     IMPRESSION / MDM / ASSESSMENT AND PLAN / ED COURSE  I reviewed the triage vital signs and the nursing notes. Patient's presentation is most consistent with exacerbation of chronic illness.  Patient presents with abdominal pain as detailed above, she  has had this pain for over a month, she saw her PCP for this on 24 September as well.  She has acute on chronic pain, will treat with IV narcotics, IV fluids, IV Zofran and reevaluate, do not feel repeat imaging is necessary at this time given reassuring lab work, similar presentation as prior  After second dose of IV pain medication, patient feeling better, she would like to be discharged because she thinks her pain is under control now.  She knows she can return anytime if symptoms worsen        FINAL CLINICAL IMPRESSION(S) / ED DIAGNOSES   Final diagnoses:  Left lower quadrant abdominal pain     Rx / DC Orders   ED Discharge Orders     None        Note:  This document was prepared using Dragon voice recognition software and may include unintentional dictation errors.   Jene Every, MD 07/24/23 [phone]

## 2023-09-18 DIAGNOSIS — I671 Cerebral aneurysm, nonruptured: Secondary | ICD-10-CM | POA: Insufficient documentation

## 2023-09-18 NOTE — Progress Notes (Unsigned)
MRN : [id number]  Betty Ferguson is a 62 y.o. (07-29-1961) female who presents with chief complaint of check circulation.  History of Present Illness:   The patient presents to the office for evaluation of an abdominal aortic aneurysm. The aneurysm was found incidentally by CT scan dated.   I have personally reviewed the CT scan and concur there is a small 6 to 7 mm saccular aneurysm of the infrarenal aorta that appears to be filled with thrombus.  Patient denies abdominal pain or unusual back pain, no other abdominal complaints.  No history of an abrupt onset of a painful toe associated with blue discoloration.     No family history of AAA.   Patient denies amaurosis fugax or TIA symptoms. There is no history of claudication or rest pain symptoms of the lower extremities.  The patient denies angina or shortness of breath.    No outpatient medications have been marked as taking for the 09/19/23 encounter (Appointment) with Gilda Crease, Latina Craver, MD.    Past Medical History:  Diagnosis Date   Cancer [facility])    pancreatic   Collagen vascular disease (HCC)    Fibromyalgia    H/O liver cancer    Lupus    Rheumatoid arthritis (HCC)     Past Surgical History:  Procedure Laterality Date   BACK SURGERY     CHOLECYSTECTOMY     LIVER SURGERY     PANCREAS SURGERY     PANCREATECTOMY     ROTATOR CUFF REPAIR Right    TOTAL ABDOMINAL HYSTERECTOMY      Social History Social History   Tobacco Use   Smoking status: Former    Current packs/day: 0.00    Types: Cigarettes    Quit date: 2020    Years since quitting: 4.9   Smokeless tobacco: Never  Vaping Use   Vaping status: Never Used  Substance Use Topics   Alcohol use: No   Drug use: No    Family History Family History  Problem Relation Age of Onset   Coronary artery disease Father    Heart disease Sister     Allergies  Allergen Reactions   Toradol  [Ketorolac Tromethamine] Itching   Doxycycline Hives   Morphine And Codeine      REVIEW OF SYSTEMS (Negative unless checked)  Constitutional: [] Weight loss  [] Fever  [] Chills Cardiac: [] Chest pain   [] Chest pressure   [] Palpitations   [] Shortness of breath when laying flat   [] Shortness of breath with exertion. Vascular:  [x] Pain in legs with walking   [] Pain in legs at rest  [] History of DVT   [] Phlebitis   [] Swelling in legs   [] Varicose veins   [] Non-healing ulcers Pulmonary:   [] Uses home oxygen   [] Productive cough   [] Hemoptysis   [] Wheeze  [] COPD   [] Asthma Neurologic:  [] Dizziness   [] Seizures   [] History of stroke   [] History of TIA  [] Aphasia   [] Vissual changes   [] Weakness or numbness in arm   [] Weakness or numbness in leg Musculoskeletal:   [] Joint swelling   [] Joint pain   [] Low  back pain Hematologic:  [] Easy bruising  [] Easy bleeding   [] Hypercoagulable state   [] Anemic Gastrointestinal:  [] Diarrhea   [] Vomiting  [] Gastroesophageal reflux/heartburn   [] Difficulty swallowing. Genitourinary:  [] Chronic kidney disease   [] Difficult urination  [] Frequent urination   [] Blood in urine Skin:  [] Rashes   [] Ulcers  Psychological:  [] History of anxiety   []  History of major depression.  Physical Examination  There were no vitals filed for this visit. There is no height or weight on file to calculate BMI. Gen: WD/WN, NAD Head: [place]/AT, No temporalis wasting.  Ear/Nose/Throat: Hearing grossly intact, nares w/o erythema or drainage Eyes: PER, EOMI, sclera nonicteric.  Neck: Supple, no masses.  No bruit or JVD.  Pulmonary:  Good air movement, no audible wheezing, no use of accessory muscles.  Cardiac: RRR, normal S1, S2, no Murmurs. Vascular:  mild trophic changes, no open wounds Vessel Right Left  Radial Palpable Palpable  PT Not Palpable Not Palpable  DP Not Palpable Not Palpable  Gastrointestinal: soft, non-distended. No guarding/no peritoneal signs.  Musculoskeletal: M/S 5/5  throughout.  No visible deformity.  Neurologic: CN 2-12 intact. Pain and light touch intact in extremities.  Symmetrical.  Speech is fluent. Motor exam as listed above. Psychiatric: Judgment intact, Mood & affect appropriate for pt's clinical situation. Dermatologic: No rashes or ulcers noted.  No changes consistent with cellulitis.   CBC Lab Results  Component Value Date   WBC 8.8 07/24/2023   HGB 13.5 07/24/2023   HCT 43.6 07/24/2023   MCV 97.8 07/24/2023   PLT 168 07/24/2023    BMET    Component Value Date/Time   NA 136 07/24/2023 0902   K 4.0 07/24/2023 0902   CL 105 07/24/2023 0902   CO2 20 (L) 07/24/2023 0902   GLUCOSE 170 (H) 07/24/2023 0902   BUN 12 07/24/2023 0902   CREATININE 0.82 07/24/2023 0902   CALCIUM 8.7 (L) 07/24/2023 0902   GFRNONAA >60 07/24/2023 0902   GFRAA >60 01/21/2020 2325   CrCl cannot be calculated (Patient's most recent lab result is older than the maximum 21 days allowed.).  COAG Lab Results  Component Value Date   INR 1.0 03/29/2021   INR 1.04 09/07/2016    Radiology No results found.   Assessment/Plan There are no diagnoses linked to this encounter.   Levora Dredge, MD  09/18/2023 4:07 PM

## 2023-09-19 ENCOUNTER — Encounter (INDEPENDENT_AMBULATORY_CARE_PROVIDER_SITE_OTHER): Payer: 59 | Admitting: Vascular Surgery

## 2023-09-19 DIAGNOSIS — M069 Rheumatoid arthritis, unspecified: Secondary | ICD-10-CM

## 2023-09-19 DIAGNOSIS — E782 Mixed hyperlipidemia: Secondary | ICD-10-CM

## 2023-09-19 DIAGNOSIS — E119 Type 2 diabetes mellitus without complications: Secondary | ICD-10-CM

## 2023-09-19 DIAGNOSIS — I671 Cerebral aneurysm, nonruptured: Secondary | ICD-10-CM

## 2023-09-19 DIAGNOSIS — I700 Atherosclerosis of aorta: Secondary | ICD-10-CM

## 2023-10-07 ENCOUNTER — Encounter (INDEPENDENT_AMBULATORY_CARE_PROVIDER_SITE_OTHER): Payer: Self-pay | Admitting: Vascular Surgery

## 2023-11-16 ENCOUNTER — Ambulatory Visit
Admission: EM | Admit: 2023-11-16 | Discharge: 2023-11-16 | Disposition: A | Discharge status: Other Acute Inpatient Hospital | Payer: 59 | Attending: Emergency Medicine | Admitting: Emergency Medicine

## 2023-11-16 ENCOUNTER — Emergency Department: Payer: 59

## 2023-11-16 ENCOUNTER — Other Ambulatory Visit: Payer: Self-pay

## 2023-11-16 ENCOUNTER — Encounter: Payer: Self-pay | Admitting: Emergency Medicine

## 2023-11-16 ENCOUNTER — Emergency Department
Admission: EM | Admit: 2023-11-16 | Discharge: 2023-11-16 | Disposition: A | Discharge status: Home / Self Care | Payer: 59 | Attending: Emergency Medicine | Admitting: Emergency Medicine

## 2023-11-16 DIAGNOSIS — E785 Hyperlipidemia, unspecified: Secondary | ICD-10-CM | POA: Insufficient documentation

## 2023-11-16 DIAGNOSIS — I6522 Occlusion and stenosis of left carotid artery: Secondary | ICD-10-CM | POA: Insufficient documentation

## 2023-11-16 DIAGNOSIS — M069 Rheumatoid arthritis, unspecified: Secondary | ICD-10-CM | POA: Insufficient documentation

## 2023-11-16 DIAGNOSIS — M797 Fibromyalgia: Secondary | ICD-10-CM | POA: Insufficient documentation

## 2023-11-16 DIAGNOSIS — I700 Atherosclerosis of aorta: Secondary | ICD-10-CM | POA: Insufficient documentation

## 2023-11-16 DIAGNOSIS — G43109 Migraine with aura, not intractable, without status migrainosus: Secondary | ICD-10-CM | POA: Diagnosis not present

## 2023-11-16 DIAGNOSIS — Z87891 Personal history of nicotine dependence: Secondary | ICD-10-CM | POA: Diagnosis not present

## 2023-11-16 DIAGNOSIS — E119 Type 2 diabetes mellitus without complications: Secondary | ICD-10-CM | POA: Diagnosis not present

## 2023-11-16 DIAGNOSIS — I639 Cerebral infarction, unspecified: Secondary | ICD-10-CM | POA: Diagnosis not present

## 2023-11-16 DIAGNOSIS — Z79899 Other long term (current) drug therapy: Secondary | ICD-10-CM | POA: Insufficient documentation

## 2023-11-16 DIAGNOSIS — R519 Headache, unspecified: Secondary | ICD-10-CM | POA: Diagnosis present

## 2023-11-16 DIAGNOSIS — G43909 Migraine, unspecified, not intractable, without status migrainosus: Secondary | ICD-10-CM | POA: Insufficient documentation

## 2023-11-16 LAB — GLUCOSE, CAPILLARY: Glucose-Capillary: 141 mg/dL — ABNORMAL HIGH (ref 70–99)

## 2023-11-16 LAB — CBG MONITORING, ED: Glucose-Capillary: 133 mg/dL — ABNORMAL HIGH (ref 70–99)

## 2023-11-16 LAB — COMPREHENSIVE METABOLIC PANEL
ALT: 25 U/L (ref 0–44)
AST: 28 U/L (ref 15–41)
Albumin: 3.8 g/dL (ref 3.5–5.0)
Alkaline Phosphatase: 75 U/L (ref 38–126)
Anion gap: 8 (ref 5–15)
BUN: 11 mg/dL (ref 8–23)
CO2: 25 mmol/L (ref 22–32)
Calcium: 8.9 mg/dL (ref 8.9–10.3)
Chloride: 104 mmol/L (ref 98–111)
Creatinine, Ser: 0.72 mg/dL (ref 0.44–1.00)
GFR, Estimated: >60 mL/min (ref >60)
Glucose, Bld: 137 mg/dL — ABNORMAL HIGH (ref 70–99)
Potassium: 4 mmol/L (ref 3.5–5.1)
Sodium: 137 mmol/L (ref 135–145)
Total Bilirubin: 0.8 mg/dL (ref 0.0–1.2)
Total Protein: 6.4 g/dL — ABNORMAL LOW (ref 6.5–8.1)

## 2023-11-16 LAB — DIFFERENTIAL
Abs Immature Granulocytes: 0.02 10*3/uL (ref 0.00–0.07)
Basophils Absolute: 0 10*3/uL (ref 0.0–0.1)
Basophils Relative: 1 %
Eosinophils Absolute: 0 10*3/uL (ref 0.0–0.5)
Eosinophils Relative: 1 %
Immature Granulocytes: 0 %
Lymphocytes Relative: 14 %
Lymphs Abs: 0.7 10*3/uL (ref 0.7–4.0)
Monocytes Absolute: 0.2 10*3/uL (ref 0.1–1.0)
Monocytes Relative: 3 %
Neutro Abs: 4.1 10*3/uL (ref 1.7–7.7)
Neutrophils Relative %: 81 %

## 2023-11-16 LAB — CBC
HCT: 41.7 % (ref 36.0–46.0)
Hemoglobin: 12.8 g/dL (ref 12.0–15.0)
MCH: 30 pg (ref 26.0–34.0)
MCHC: 30.7 g/dL (ref 30.0–36.0)
MCV: 97.7 fL (ref 80.0–100.0)
Platelets: 181 10*3/uL (ref 150–400)
RBC: 4.27 MIL/uL (ref 3.87–5.11)
RDW: 15.9 % — ABNORMAL HIGH (ref 11.5–15.5)
WBC: 5.1 10*3/uL (ref 4.0–10.5)
nRBC: 0 % (ref 0.0–0.2)

## 2023-11-16 LAB — PROTIME-INR
INR: 0.9 (ref 0.8–1.2)
Prothrombin Time: 12.6 s (ref 11.4–15.2)

## 2023-11-16 LAB — ETHANOL: Alcohol, Ethyl (B): <10 mg/dL (ref <10)

## 2023-11-16 LAB — APTT: aPTT: 28 s (ref 24–36)

## 2023-11-16 MED ORDER — SODIUM CHLORIDE 0.9% FLUSH
3.0000 mL | Freq: Once | INTRAVENOUS | Status: AC
  Administered 2023-11-16: 3 mL via INTRAVENOUS

## 2023-11-16 MED ORDER — IOHEXOL 350 MG/ML SOLN
75.0000 mL | Freq: Once | INTRAVENOUS | Status: AC | PRN
  Administered 2023-11-16: 75 mL via INTRAVENOUS

## 2023-11-16 MED ORDER — DIPHENHYDRAMINE HCL 50 MG/ML IJ SOLN
25.0000 mg | Freq: Once | INTRAMUSCULAR | Status: AC
Start: 2023-11-16 — End: 2023-11-16
  Administered 2023-11-16: 25 mg via INTRAVENOUS
  Filled 2023-11-16: qty 1

## 2023-11-16 MED ORDER — PROCHLORPERAZINE EDISYLATE 10 MG/2ML IJ SOLN
10.0000 mg | Freq: Once | INTRAMUSCULAR | Status: AC
  Administered 2023-11-16: 10 mg via INTRAVENOUS
  Filled 2023-11-16: qty 2

## 2023-11-16 MED ORDER — PROCHLORPERAZINE MALEATE 10 MG PO TABS: 10.0000 mg | ORAL_TABLET | Freq: Four times a day (QID) | ORAL | 0 refills | Status: AC | PRN

## 2023-11-16 NOTE — ED Triage Notes (Signed)
Pt states she started having left sided weakness and numbness while driving 10 minuets ago. Pt states she is having headache on left side with dizziness and blurry vision.

## 2023-11-16 NOTE — ED Provider Notes (Signed)
[facility] Provider Note    Event Date/Time   First MD Initiated Contact with Patient 11/16/23 0900     (approximate)   History   Chief Complaint Code Stroke   HPI  Betty Ferguson is a 63 y.o. female with past medical history of hyperlipidemia, diabetes, rheumatoid arthritis, and fibromyalgia who presents to the ED for code stroke.  Patient reports that she was driving home around 1:61 this morning when she had sudden onset of left-sided headache and facial pain.  She describes numbness and weakness on her left side coming on around the same time, but was able to drive herself to a local urgent care.  EMS was called from urgent care and code stroke activated prior to arrival.  Patient does report that she had similar symptoms back in 2022 which were attributed to a complicated migraine.  Patient denies any fevers or neck stiffness, has not had any vision or speech changes.     Physical Exam   Triage Vital Signs: ED Triage Vitals  Encounter Vitals Group     BP      Systolic BP Percentile      Diastolic BP Percentile      Pulse      Resp      Temp      Temp src      SpO2      Weight      Height      Head Circumference      Peak Flow      Pain Score      Pain Loc      Pain Education      Exclude from Growth Chart     Most recent vital signs: Vitals:   11/16/23 1100 11/16/23 1130  BP: (!) 175/84 (!) 161/88  Pulse: 69 70  Resp: 13 17  Temp:    SpO2: 100% 99%    Constitutional: Alert and oriented. Eyes: Conjunctivae are normal. Head: Atraumatic. Nose: No congestion/rhinnorhea. Mouth/Throat: Mucous membranes are moist.  Neck: Supple with no meningismus. Cardiovascular: Normal rate, regular rhythm. Grossly normal heart sounds.  2+ radial pulses bilaterally. Respiratory: Normal respiratory effort.  No retractions. Lungs CTAB. Gastrointestinal: Soft and nontender. No distention. Musculoskeletal: No lower extremity tenderness nor edema.   Neurologic:  Normal speech and language. No gross focal neurologic deficits are appreciated.    ED Results / Procedures / Treatments   Labs (all labs ordered are listed, but only abnormal results are displayed) Labs Reviewed  CBC - Abnormal; Notable for the following components:      Result Value   RDW 15.9 (*)    All other components within normal limits  COMPREHENSIVE METABOLIC PANEL - Abnormal; Notable for the following components:   Glucose, Bld 137 (*)    Total Protein 6.4 (*)    All other components within normal limits  CBG MONITORING, ED - Abnormal; Notable for the following components:   Glucose-Capillary 133 (*)    All other components within normal limits  PROTIME-INR  APTT  DIFFERENTIAL  ETHANOL     EKG  ED ECG REPORT I, Chesley Noon, the attending physician, personally viewed and interpreted this ECG.   Date: 11/16/2023  EKG Time: 9:36  Rate: 69  Rhythm: normal sinus rhythm  Axis: Normal  Intervals:none  ST&T Change: None  RADIOLOGY CT head reviewed and interpreted by me with no hemorrhage or midline shift.  PROCEDURES:  Critical Care performed: No  Procedures   MEDICATIONS  ORDERED IN ED: Medications  sodium chloride flush (NS) 0.9 % injection 3 mL (3 mLs Intravenous Given 11/16/23 1022)  iohexol (OMNIPAQUE) 350 MG/ML injection 75 mL (75 mLs Intravenous Contrast Given 11/16/23 1034)  prochlorperazine (COMPAZINE) injection 10 mg (10 mg Intravenous Given 11/16/23 1022)  diphenhydrAMINE (BENADRYL) injection 25 mg (25 mg Intravenous Given 11/16/23 1022)     IMPRESSION / MDM / ASSESSMENT AND PLAN / ED COURSE  I reviewed the triage vital signs and the nursing notes.                              63 y.o. female with past medical history of hyperlipidemia, diabetes, rheumatoid arthritis, and fibromyalgia who presents to the ED complaining of sudden onset left-sided headache with numbness and weakness on the left side.  Patient's presentation is  most consistent with acute presentation with potential threat to life or bodily function.  Differential diagnosis includes, but is not limited to, SAH, meningitis, stroke, arterial dissection, migraine headache, tension headache, complicated migraine.  Patient nontoxic-appearing and in no acute distress, vital signs are unremarkable.  Code stroke activated prior to arrival and she was immediately met on arrival to the ED by myself and Dr. Selina Cooley of neurology, CT head is negative for acute process.  No objective weakness noted on exam per neurology, suspect complicated migraine as patient has presented similarly in the past and we will hold off on TNK.  Plan to check CTA head and neck, treat symptomatically with IV Compazine and Benadryl.  Lab results are also pending at this time.  CTA head and neck negative for acute process, labs are reassuring with no sniffing anemia, leukocytosis, electrolyte abnormality, or AKI.  LFTs are also unremarkable.  On reassessment, patient reports that her headache is improving and left-sided numbness has resolved.  Suspect complicated migraine and patient appropriate for outpatient neurology follow-up.  She was counseled to return to the ED for new or worsening symptoms, patient and family agree with plan.      FINAL CLINICAL IMPRESSION(S) / ED DIAGNOSES   Final diagnoses:  Complicated migraine     Rx / DC Orders   ED Discharge Orders          Ordered    prochlorperazine (COMPAZINE) 10 MG tablet  Every 6 hours PRN        11/16/23 1159             Note:  This document was prepared using Dragon voice recognition software and may include unintentional dictation errors.   Chesley Noon, MD 11/16/23 1200

## 2023-11-16 NOTE — Progress Notes (Signed)
Responded to code stroke paged out-visited with pt-no needs at this time-staff actively in room-pt aware she can reach out to [place] when ever. Staff will call if pt request follow up.

## 2023-11-16 NOTE — ED Notes (Signed)
Patient to CT at this time

## 2023-11-16 NOTE — ED Triage Notes (Signed)
Patient to ED via [facility] from [facility] for Stroke like Symptoms. Code Stroke called by EMS- left sided weakness or numbness. LWK 0800. Also having headache on left side.
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
## 2023-11-16 NOTE — ED Notes (Signed)
Patient assisted to restroom by this RN

## 2023-11-16 NOTE — ED Notes (Signed)
IV team at bedside
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
## 2023-11-16 NOTE — Discharge Instructions (Addendum)
Please go to [facility] to be evaluated for possible CVA.

## 2023-11-16 NOTE — Consult Note (Signed)
NEUROLOGY CONSULT NOTE   Date of service: November 16, 2023 Patient Name: Betty Ferguson MRN:  [id number] DOB:  1961-10-04 Chief Complaint: L sided headache, L sided numbness Requesting Provider: Chesley Noon, MD  History of Present Illness   63 yo woman with hx complex migraines p/w severe L-sided headache and L sided weakness and numbness. LKW 0800. Focal weakness had resolved by the time she arrived to ED. NIHSS = 7, see below, but the only focal finding was L-sided numbness.  CT head without contrast personal review showed no acute process.  TNK was not administered due to with the only focal finding being left-sided numbness (too mild to treat).  After the stroke code was completed CTA head and neck was performed which showed no hemodynamically significant stenosis.  Patient had a similar presentation in 2022 when she presented with left-sided headache and left-sided numbness with negative workup which was attributed to complex migraine.  Patient was observed in the ED with VS and NIHSS q 30 min until she was outside the window for TNK. Her headache improved after administration of compazine and when her headache improved her neurologic deficits resolved.  LKW: 0800 Modified rankin score: 0-Completely asymptomatic and back to baseline post- stroke IV Thrombolysis: No, only focal finding was numbness (too mild to treat) EVT: No, exam not c/w LVO  NIHSS components Score: Comment  1a Level of Conscious 0[x]  1[]  2[]  3[]      1b LOC Questions 0[x]  1[]  2[]       1c LOC Commands 0[x]  1[]  2[]       2 Best Gaze 0[x]  1[]  2[]       3 Visual 0[x]  1[]  2[]  3[]      4 Facial Palsy 0[x]  1[]  2[]  3[]      5a Motor Arm - left 0[]  1[x]  2[]  3[]  4[]  UN[]    5b Motor Arm - Right 0[]  1[x]  2[]  3[]  4[]  UN[]    6a Motor Leg - Left 0[]  1[]  2[x]  3[]  4[]  UN[]    6b Motor Leg - Right 0[]  1[]  2[x]  3[]  4[]  UN[]    7 Limb Ataxia 0[x]  1[]  2[]  3[]  UN[]     8 Sensory 0[]  1[x]  2[]  UN[]      9 Best Language 0[x]  1[]  2[]  3[]      10  Dysarthria 0[x]  1[]  2[]  UN[]      11 Extinct. and Inattention 0[x]  1[]  2[]       TOTAL:  7      ROS   Comprehensive ROS performed and pertinent positives documented in HPI   Past History   Past Medical History:  Diagnosis Date   Cancer (HCC)    pancreatic   Collagen vascular disease (HCC)    Fibromyalgia    H/O liver cancer    Lupus    Rheumatoid arthritis (HCC)     Past Surgical History:  Procedure Laterality Date   BACK SURGERY     CHOLECYSTECTOMY     LIVER SURGERY     PANCREAS SURGERY     PANCREATECTOMY     ROTATOR CUFF REPAIR Right    TOTAL ABDOMINAL HYSTERECTOMY      Family History: Family History  Problem Relation Age of Onset   Coronary artery disease Father    Heart disease Sister     Social History  reports that she quit smoking about 5 years ago. Her smoking use included cigarettes. She has never used smokeless tobacco. She reports that she does not drink alcohol and does not use drugs.  Allergies  Allergen Reactions   Toradol [  Ketorolac Tromethamine] Itching   Doxycycline Hives   Morphine And Codeine     Medications  No current facility-administered medications for this encounter.  Current Outpatient Medications:    prochlorperazine (COMPAZINE) 10 MG tablet, Take 1 tablet (10 mg total) by mouth every 6 (six) hours as needed., Disp: 12 tablet, Rfl: 0   aspirin 81 MG chewable tablet, Chew by mouth daily., Disp: , Rfl:    atorvastatin (LIPITOR) 40 MG tablet, Take 40 mg by mouth daily., Disp: , Rfl:    busPIRone (BUSPAR) 10 MG tablet, Take 10 mg by mouth 2 (two) times daily. (Patient not taking: Reported on 11/16/2023), Disp: , Rfl:    cetirizine (ZYRTEC) 10 MG tablet, Take 10 mg by mouth daily., Disp: , Rfl:    clonazePAM (KLONOPIN) 0.5 MG tablet, Take 0.5 mg by mouth at bedtime as needed for anxiety., Disp: , Rfl:    cyclobenzaprine (FLEXERIL) 10 MG tablet, Take 1 tablet (10 mg total) by mouth 3 (three) times daily as needed for muscle spasms., Disp:  30 tablet, Rfl: 0   docusate sodium (COLACE) 100 MG capsule, Take 100 mg by mouth daily as needed for mild constipation., Disp: , Rfl:    ergocalciferol (VITAMIN D2) 1.25 MG (50000 UT) capsule, Take 50,000 Units by mouth once a week., Disp: , Rfl:    famotidine (PEPCID) 20 MG tablet, Take 1 tablet (20 mg total) by mouth 2 (two) times daily., Disp: 60 tablet, Rfl: 0   fluticasone (FLONASE) 50 MCG/ACT nasal spray, Place 2 sprays into both nostrils daily. (Patient not taking: Reported on 06/12/2023), Disp: , Rfl:    folic acid (FOLVITE) 1 MG tablet, Take 1 mg by mouth daily., Disp: , Rfl:    gabapentin (NEURONTIN) 600 MG tablet, Take 600 mg by mouth 2 (two) times daily., Disp: , Rfl:    ibuprofen (ADVIL) 800 MG tablet, Take 800 mg by mouth every 8 (eight) hours as needed for mild pain., Disp: , Rfl:    methadone (DOLOPHINE) 10 MG tablet, Take 10 mg by mouth 2 (two) times daily., Disp: , Rfl:    methotrexate (RHEUMATREX) 2.5 MG tablet, Take 2.5 mg by mouth once a week. Caution:Chemotherapy. Protect from light.Take 5 tablets by mouth in the morning and 5 tablets in the evening once per week, Disp: , Rfl:    naloxone (NARCAN) nasal spray 4 mg/0.1 mL, Place 1 spray into the nose once., Disp: , Rfl:    ondansetron (ZOFRAN-ODT) 4 MG disintegrating tablet, Take 1 tablet (4 mg total) by mouth every 8 (eight) hours as needed for nausea or vomiting., Disp: 20 tablet, Rfl: 0   oxyCODONE (OXY IR/ROXICODONE) 5 MG immediate release tablet, Take 1 tablet (5 mg total) by mouth every 4 (four) hours as needed for breakthrough pain (can give in addition to / at same time as oxycodone 10 mg po)., Disp: 30 tablet, Rfl: 0   oxyCODONE-acetaminophen (PERCOCET) 10-325 MG tablet, Take 1 tablet by mouth every 4 (four) hours as needed., Disp: , Rfl:    pantoprazole (PROTONIX) 40 MG tablet, Take 40 mg by mouth daily., Disp: , Rfl:    predniSONE (DELTASONE) 2.5 MG tablet, Take 2.5 mg by mouth daily with breakfast., Disp: , Rfl:     QUEtiapine (SEROQUEL) 100 MG tablet, Take 100 mg by mouth at bedtime., Disp: , Rfl:    Semaglutide,0.25 or 0.5MG /DOS, 2 MG/3ML SOPN, Inject 0.25-0.5 mg into the skin once a week. Inject 0.375 mLs (0.25 mg total) subcutaneously once a week  X 4 weeks. Then increase to 0.5mg  weekly, Disp: , Rfl:    Tofacitinib Citrate ER 11 MG TB24, Take by mouth., Disp: , Rfl:    TRELEGY ELLIPTA 200-62.5-25 MCG/ACT AEPB, Inhale 1 puff into the lungs daily., Disp: , Rfl:   Vitals   Vitals:   2023-11-22 1030 November 22, 2023 1100 Nov 22, 2023 1130 11/22/23 1200  BP: 135/84 (!) 175/84 (!) 161/88 (!) 168/97  Pulse: 80 69 70 70  Resp: 11 13 17 14   Temp:      TempSrc:      SpO2: 100% 100% 99% 99%  Weight:      Height:        Body mass index is 32.12 kg/m.  Physical Exam   Constitutional: Appears well-developed and well-nourished.  Psych: Affect appropriate to situation.  Eyes: No scleral injection.  HENT: No OP obstruction.  Head: Normocephalic.  Cardiovascular: Normal rate and regular rhythm.  Respiratory: Effort normal, non-labored breathing.  GI: Soft.  No distension. There is no tenderness.  Skin: WDI.   Neurologic Examination   *MS: A&O x4. Follows multi-step commands.  *Speech: no dysarthria or aphasia, able to name and repeat. *CN:    I: Deferred   II,III: PERRLA, VFF by confrontation, optic discs not visualized 2/2 pupillary constriction   III,IV,VI: EOMI w/o nystagmus, no ptosis   V: Sensation impaired on L   VII: Eyelid closure was full.  Smile symmetric.   VIII: Hearing intact to voice   IX,X: Voice normal, palate elevates symmetrically    XI: SCM/trap 5/5 bilat   XII: Tongue protrudes midline, no atrophy or fasciculations  *Motor:   Normal bulk.  No tremor, rigidity or bradykinesia. Drift but not to bed BUE, drift to bed BLE, symmetric. *Sensory: Sensory impairment LUE and LLE. No double-simultaneous extinction.  *Coordination:  FNF intact on R, limited by pain on L *Reflexes:  2+ and  symmetric throughout without clonus; toes down-going bilat *Gait: deferred  Labs/Imaging/Neurodiagnostic studies   CBC:  Recent Labs  Lab 2023-11-22 1002  WBC 5.1  NEUTROABS 4.1  HGB 12.8  HCT 41.7  MCV 97.7  PLT 181   Basic Metabolic Panel:  Lab Results  Component Value Date   NA 137 Nov 22, 2023   K 4.0 2023/11/22   CO2 25 November 22, 2023   GLUCOSE 137 (H) 11-22-2023   BUN 11 November 22, 2023   CREATININE 0.72 2023-11-22   CALCIUM 8.9 November 22, 2023   GFRNONAA >60 Nov 22, 2023   GFRAA >60 01/21/2020   Lipid Panel:  Lab Results  Component Value Date   LDLCALC 103 (H) 03/30/2021   HgbA1c:  Lab Results  Component Value Date   HGBA1C 7.8 (H) 06/11/2023   Urine Drug Screen: No results found for: "LABOPIA", "COCAINSCRNUR", "LABBENZ", "AMPHETMU", "THCU", "LABBARB"  Alcohol Level     Component Value Date/Time   ETH <10 Nov 22, 2023 1002   INR  Lab Results  Component Value Date   INR 0.9 11-22-23   APTT  Lab Results  Component Value Date   APTT 28 2023-11-22   AED levels: No results found for: "PHENYTOIN", "ZONISAMIDE", "LAMOTRIGINE", "LEVETIRACETA"  CT Head without contrast(Personally reviewed): No acute process  CT angio Head and Neck with contrast(Personally reviewed): 1. No emergent arterial finding. 2. Atherosclerosis without flow reducing stenosis of major arteries in the head and neck. Note that the proximal vertebral arteries (with left vertebral origin stenosis highlighted on prior CTA in 2022) are largely obscured from streak artifact.  ASSESSMENT   63 yo woman with hx complex migraines p/w  severe L-sided headache and L-sided numbness since 0800. Head CT showed no acute findings. NIHSS = 7 but only focal finding was L-sided numbness which was felt to be too mild to tx with TNK. Patient had a similar presentation in 2022 when she presented with left-sided headache and left-sided numbness with negative workup which was attributed to complex migraine.  Patient was  observed in the ED with VS and NIHSS q 30 min until she was outside the window for TNK. Her headache improved after administration of compazine and when her headache improved her neurologic deficits resolved. Presentation c/w complex migraine; no further inpatient workup required.  RECOMMENDATIONS   - OK to discharge from ED ______________________________________________________________________    Signed, Jefferson Fuel, MD [facility]

## 2023-11-25 ENCOUNTER — Emergency Department: Payer: 59

## 2023-11-25 ENCOUNTER — Other Ambulatory Visit: Payer: Self-pay

## 2023-11-25 ENCOUNTER — Emergency Department
Admission: EM | Admit: 2023-11-25 | Discharge: 2023-11-25 | Disposition: A | Discharge status: Home / Self Care | Payer: 59 | Attending: Emergency Medicine | Admitting: Emergency Medicine

## 2023-11-25 DIAGNOSIS — E119 Type 2 diabetes mellitus without complications: Secondary | ICD-10-CM | POA: Diagnosis not present

## 2023-11-25 DIAGNOSIS — K5901 Slow transit constipation: Secondary | ICD-10-CM

## 2023-11-25 DIAGNOSIS — R1031 Right lower quadrant pain: Secondary | ICD-10-CM | POA: Diagnosis not present

## 2023-11-25 DIAGNOSIS — Z8673 Personal history of transient ischemic attack (TIA), and cerebral infarction without residual deficits: Secondary | ICD-10-CM | POA: Insufficient documentation

## 2023-11-25 LAB — CBC WITH DIFFERENTIAL/PLATELET
Abs Immature Granulocytes: 0.04 10*3/uL (ref 0.00–0.07)
Basophils Absolute: 0 10*3/uL (ref 0.0–0.1)
Basophils Relative: 0 %
Eosinophils Absolute: 0.1 10*3/uL (ref 0.0–0.5)
Eosinophils Relative: 1 %
HCT: 41.4 % (ref 36.0–46.0)
Hemoglobin: 13 g/dL (ref 12.0–15.0)
Immature Granulocytes: 1 %
Lymphocytes Relative: 22 %
Lymphs Abs: 1.4 10*3/uL (ref 0.7–4.0)
MCH: 30.7 pg (ref 26.0–34.0)
MCHC: 31.4 g/dL (ref 30.0–36.0)
MCV: 97.6 fL (ref 80.0–100.0)
Monocytes Absolute: 0.5 10*3/uL (ref 0.1–1.0)
Monocytes Relative: 9 %
Neutro Abs: 4.2 10*3/uL (ref 1.7–7.7)
Neutrophils Relative %: 67 %
Platelets: 212 10*3/uL (ref 150–400)
RBC: 4.24 MIL/uL (ref 3.87–5.11)
RDW: 15.7 % — ABNORMAL HIGH (ref 11.5–15.5)
WBC: 6.2 10*3/uL (ref 4.0–10.5)
nRBC: 0 % (ref 0.0–0.2)

## 2023-11-25 LAB — BASIC METABOLIC PANEL
Anion gap: 11 (ref 5–15)
BUN: 15 mg/dL (ref 8–23)
CO2: 25 mmol/L (ref 22–32)
Calcium: 9.2 mg/dL (ref 8.9–10.3)
Chloride: 103 mmol/L (ref 98–111)
Creatinine, Ser: 0.95 mg/dL (ref 0.44–1.00)
GFR, Estimated: >60 mL/min (ref >60)
Glucose, Bld: 135 mg/dL — ABNORMAL HIGH (ref 70–99)
Potassium: 4 mmol/L (ref 3.5–5.1)
Sodium: 139 mmol/L (ref 135–145)

## 2023-11-25 MED ORDER — ONDANSETRON 4 MG PO TBDP
4.0000 mg | ORAL_TABLET | Freq: Three times a day (TID) | ORAL | 0 refills | Status: AC | PRN
Start: 2023-11-25 — End: ?

## 2023-11-25 MED ORDER — POLYETHYLENE GLYCOL 3350 17 G PO PACK: 17.0000 g | PACK | Freq: Every day | ORAL | 0 refills | Status: AC

## 2023-11-25 MED ORDER — IOHEXOL 300 MG/ML  SOLN
100.0000 mL | Freq: Once | INTRAMUSCULAR | Status: AC | PRN
  Administered 2023-11-25: 100 mL via INTRAVENOUS

## 2023-11-25 MED ORDER — MAGNESIUM CITRATE PO SOLN
1.0000 | Freq: Once | ORAL | 1 refills | Status: AC
Start: 2023-11-25 — End: 2023-11-25

## 2023-11-25 MED ORDER — DOCUSATE SODIUM 100 MG PO CAPS: 100.0000 mg | ORAL_CAPSULE | Freq: Two times a day (BID) | ORAL | 2 refills | Status: AC

## 2023-11-25 MED ORDER — FENTANYL CITRATE PF 50 MCG/ML IJ SOSY
50.0000 ug | PREFILLED_SYRINGE | Freq: Once | INTRAMUSCULAR | Status: AC
  Administered 2023-11-25: 50 ug via INTRAVENOUS
  Filled 2023-11-25: qty 1

## 2023-11-25 MED ORDER — FENTANYL CITRATE PF 50 MCG/ML IJ SOSY
50.0000 ug | PREFILLED_SYRINGE | Freq: Once | INTRAMUSCULAR | Status: AC
  Administered 2023-11-25: 50 ug via INTRAMUSCULAR
  Filled 2023-11-25: qty 1

## 2023-11-25 MED ORDER — ONDANSETRON 4 MG PO TBDP
4.0000 mg | ORAL_TABLET | Freq: Once | ORAL | Status: AC
  Administered 2023-11-25: 4 mg via ORAL
  Filled 2023-11-25: qty 1

## 2023-11-25 NOTE — ED Triage Notes (Addendum)
Pt to ed from home via [facility]  for right sided groin pain that started today with sharp pains shooting down her right leg. Pt is caox4. Pt  has bilateral shoulder pain but denies any falls.  203/97 116 BGL Pt refuses having HTN. Pt has no complaints related to HTN. Pt is caox4 in no acute distress.

## 2023-11-25 NOTE — ED Provider Notes (Signed)
[facility] Emergency Department Provider Note     Event Date/Time   First MD Initiated Contact with Patient 11/25/23 1943     (approximate)   History   Leg Pain (right)   HPI  Betty Ferguson is a 63 y.o. female with a history of TIA, rheumatoid arthritis, HLD, diabetes, and lupus, presents to the ED via EMS from home.  Patient would endorse right sided abdominal pain from the groin that shoots down her anterior right upper thigh.  She describes the pain as a sharp in nature.  She denies any preceding injury, trauma, or fall.  No bladder or bowel incontinence, foot drop, saddle anesthesias reported.  Patient would also endorse nausea and vomiting 3 days prior, that resolved spontaneously.     Physical Exam   Triage Vital Signs: ED Triage Vitals  Encounter Vitals Group     BP 11/25/23 1924 (!) 158/80     Systolic BP Percentile --      Diastolic BP Percentile --      Pulse Rate 11/25/23 1924 74     Resp 11/25/23 1924 20     Temp 11/25/23 1924 97.9 F (36.6 C)     Temp Source 11/25/23 1924 Oral     SpO2 11/25/23 1924 100 %     Weight 11/25/23 1923 174 lb 2.6 oz (79 kg)     Height 11/25/23 1923 5\' 2"  (1.575 m)     Head Circumference --      Peak Flow --      Pain Score 11/25/23 1923 10     Pain Loc --      Pain Education --      Exclude from Growth Chart --     Most recent vital signs: Vitals:   11/25/23 1924  BP: (!) 158/80  Pulse: 74  Resp: 20  Temp: 97.9 F (36.6 C)  SpO2: 100%    General Awake, no distress. NAD HEENT NCAT. PERRL. EOMI. No rhinorrhea. Mucous membranes are moist.  CV:  Good peripheral perfusion. RRR. No CCE distally. RESP:  Normal effort. CTA ABD:  No distention.  Tender palpation to the right lower quadrant.  Normoactive bowel sounds x 4 noted.  No rebound or guarding appreciated. MSK:  Normal flexion extension range to the RLE.  Patient tender to palpation to the anterior hamstring insertion.   ED Results /  Procedures / Treatments   Labs (all labs ordered are listed, but only abnormal results are displayed) Labs Reviewed  CBC WITH DIFFERENTIAL/PLATELET - Abnormal; Notable for the following components:      Result Value   RDW 15.7 (*)    All other components within normal limits  BASIC METABOLIC PANEL - Abnormal; Notable for the following components:   Glucose, Bld 135 (*)    All other components within normal limits     EKG   RADIOLOGY  No results found.   PROCEDURES:  Critical Care performed: No  Procedures   MEDICATIONS ORDERED IN ED: Medications  fentaNYL (SUBLIMAZE) injection 50 mcg (50 mcg Intramuscular Given 11/25/23 2042)  ondansetron (ZOFRAN-ODT) disintegrating tablet 4 mg (4 mg Oral Given 11/25/23 2040)     IMPRESSION / MDM / ASSESSMENT AND PLAN / ED COURSE  I reviewed the triage vital signs and the nursing notes.                              Differential diagnosis includes,  but is not limited to, ovarian cyst, ovarian torsion, acute appendicitis, diverticulitis, urinary tract infection/pyelonephritis, bowel obstruction, colitis, renal colic, gastroenteritis, hernia, etc.   Patient's presentation is most consistent with acute complicated illness / injury requiring diagnostic workup.  Patient's diagnosis is consistent with right lower quadrant abdominal pain of unclear etiology.  Patient presented initially with complaints of what she described as "groin" pain, but localized pain to the right lower abdominal region.  Labs are reassuring at this time without a leukocytosis or critical anemia noted.  No electrolyte abnormalities appreciated.  IV team's been consulted to establish IV access, for CT abdomen pelvis with contrast to evaluate patient's source of pain. Patient has been given IM and oral doses of pain and nausea medicine, respectively prior to IV access.  ----------------------------------------- 9:20 PM on  11/25/2023 ----------------------------------------- Care was transferred to my attending Jene Every, MD at this time.  He will follow-up pending CT results and disposition the patient accordingly.    FINAL CLINICAL IMPRESSION(S) / ED DIAGNOSES   Final diagnoses:  Right lower quadrant abdominal pain     Rx / DC Orders   ED Discharge Orders     None        Note:  This document was prepared using Dragon voice recognition software and may include unintentional dictation errors.    Lissa Hoard, PA-C 11/25/23 2120    Jene Every, MD 11/25/23 2216

## 2023-11-25 NOTE — ED Provider Notes (Signed)
  Physical Exam  BP (!) 158/80 (BP Location: Left Arm)   Pulse 74   Temp 97.9 F (36.6 C) (Oral)   Resp 20   Ht 5\' 2"  (1.575 m)   Wt 77.1 kg   SpO2 100%   BMI 31.09 kg/m   Physical Exam Constitutional:      Appearance: Normal appearance.  HENT:     Head: Normocephalic and atraumatic.  Abdominal:     Tenderness: There is abdominal tenderness.  Musculoskeletal:        General: Normal range of motion.  Skin:    General: Skin is warm.  Neurological:     Mental Status: She is alert.     Procedures  Procedures  ED Course / MDM    Medical Decision Making Received patient at signout.  Briefly this is a 63 year old female primarily here for abdominal pain.  Labs are reassuring.  CBC and CMP are overall unremarkable.  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  Large amount of stool burden. I discussed case with attending Dr. Cyril Loosen who recommends outpatient treatment.  I discussed findings with the patient and recommended laxatives and hydration.  Patient verbalized understanding this has been an issue for quite a while has seen a [facility] gastroenterologist in the past but is unsure if she can get back in with that 1 recommended local gastroenterology and given return precautions.  Amount and/or Complexity of Data Reviewed Labs: ordered. Radiology: ordered.  Risk OTC drugs. Prescription drug management.          Christen Bame, PA-C 11/25/23 2221    Jene Every, MD 11/25/23 2249

## 2024-01-12 ENCOUNTER — Emergency Department

## 2024-01-12 ENCOUNTER — Encounter: Payer: Self-pay | Admitting: *Deleted

## 2024-01-12 ENCOUNTER — Emergency Department
Admission: EM | Admit: 2024-01-12 | Discharge: 2024-01-12 | Disposition: A | Discharge status: Home / Self Care | Attending: Emergency Medicine | Admitting: Emergency Medicine

## 2024-01-12 ENCOUNTER — Other Ambulatory Visit: Payer: Self-pay

## 2024-01-12 DIAGNOSIS — K2100 Gastro-esophageal reflux disease with esophagitis, without bleeding: Secondary | ICD-10-CM | POA: Insufficient documentation

## 2024-01-12 DIAGNOSIS — R0789 Other chest pain: Secondary | ICD-10-CM | POA: Diagnosis present

## 2024-01-12 LAB — BASIC METABOLIC PANEL WITH GFR
Anion gap: 9 (ref 5–15)
BUN: 18 mg/dL (ref 8–23)
CO2: 26 mmol/L (ref 22–32)
Calcium: 9.1 mg/dL (ref 8.9–10.3)
Chloride: 104 mmol/L (ref 98–111)
Creatinine, Ser: 0.88 mg/dL (ref 0.44–1.00)
GFR, Estimated: >60 mL/min (ref >60)
Glucose, Bld: 107 mg/dL — ABNORMAL HIGH (ref 70–99)
Potassium: 3.6 mmol/L (ref 3.5–5.1)
Sodium: 139 mmol/L (ref 135–145)

## 2024-01-12 LAB — CBC
HCT: 44.3 % (ref 36.0–46.0)
Hemoglobin: 14 g/dL (ref 12.0–15.0)
MCH: 30.8 pg (ref 26.0–34.0)
MCHC: 31.6 g/dL (ref 30.0–36.0)
MCV: 97.4 fL (ref 80.0–100.0)
Platelets: 220 10*3/uL (ref 150–400)
RBC: 4.55 MIL/uL (ref 3.87–5.11)
RDW: 15.5 % (ref 11.5–15.5)
WBC: 5.6 10*3/uL (ref 4.0–10.5)
nRBC: 0 % (ref 0.0–0.2)

## 2024-01-12 LAB — TROPONIN I (HIGH SENSITIVITY): Troponin I (High Sensitivity): 6 ng/L (ref <18)

## 2024-01-12 MED ORDER — LIDOCAINE VISCOUS HCL 2 % MT SOLN: 15.0000 mL | OROMUCOSAL | 0 refills | Status: AC | PRN

## 2024-01-12 MED ORDER — LIDOCAINE VISCOUS HCL 2 % MT SOLN
15.0000 mL | Freq: Once | OROMUCOSAL | Status: AC
  Administered 2024-01-12: 15 mL via ORAL
  Filled 2024-01-12: qty 15

## 2024-01-12 MED ORDER — ALUM & MAG HYDROXIDE-SIMETH 200-200-20 MG/5ML PO SUSP
30.0000 mL | Freq: Once | ORAL | Status: AC
  Administered 2024-01-12: 30 mL via ORAL
  Filled 2024-01-12: qty 30

## 2024-01-12 MED ORDER — SUCRALFATE 1 G PO TABS: 1.0000 g | ORAL_TABLET | Freq: Four times a day (QID) | ORAL | 0 refills | Status: AC

## 2024-01-12 MED ORDER — ACETAMINOPHEN 500 MG PO TABS
1000.0000 mg | ORAL_TABLET | Freq: Once | ORAL | Status: AC
  Administered 2024-01-12: 1000 mg via ORAL
  Filled 2024-01-12: qty 2

## 2024-01-12 NOTE — ED Triage Notes (Signed)
 Pt to triage via wheelchair.  Pt brought in via ems from home.   Pt reports burning in chest , neck and shoulders. Pt reports heartburn feeling in throat and chest.  Sx for 2 days, worse today.   Pt alert  speech clear.

## 2024-01-12 NOTE — ED Notes (Addendum)
 Pt reports improvement with chest burning/pain after GI cocktail, c/o headache 8/10, EDP aware

## 2024-01-12 NOTE — ED Provider Notes (Signed)
 [facility] Provider Note    Event Date/Time   First MD Initiated Contact with Patient 01/12/24 1813     (approximate)   History   Chest pain   HPI  Betty Ferguson is a 63 y.o. female  who presents to the emergency department today because of concern for chest pain and burning. Symptoms started yesterday. Located in her center chest. States she has had acid reflux in the past but has never had anything that has hurt this bad. The patient denies any fevers or chills. States that she is having pain in her shoulders, but has had that in the past and is seeing a pain doctor for it.     Physical Exam   Triage Vital Signs: ED Triage Vitals  Encounter Vitals Group     BP 01/12/24 1522 (!) 144/84     Systolic BP Percentile --      Diastolic BP Percentile --      Pulse Rate 01/12/24 1522 83     Resp 01/12/24 1522 18     Temp 01/12/24 1522 98.4 F (36.9 C)     Temp Source 01/12/24 1522 Oral     SpO2 01/12/24 1522 100 %     Weight 01/12/24 1519 174 lb (78.9 kg)     Height 01/12/24 1519 5\' 2"  (1.575 m)     Head Circumference --      Peak Flow --      Pain Score 01/12/24 1519 8     Pain Loc --      Pain Education --      Exclude from Growth Chart --     Most recent vital signs: Vitals:   01/12/24 1522  BP: (!) 144/84  Pulse: 83  Resp: 18  Temp: 98.4 F (36.9 C)  SpO2: 100%   General: Awake, alert, oriented. CV:  Good peripheral perfusion. Regular rate and rhythm. Resp:  Normal effort. Lungs clear. Abd:  No distention.  Other:  Mild tenderness to upper abdomen.    ED Results / Procedures / Treatments   Labs (all labs ordered are listed, but only abnormal results are displayed) Labs Reviewed  BASIC METABOLIC PANEL WITH GFR - Abnormal; Notable for the following components:      Result Value   Glucose, Bld 107 (*)    All other components within normal limits  CBC  TROPONIN I (HIGH SENSITIVITY)     EKG  I, Phineas Semen, attending  physician, personally viewed and interpreted this EKG  EKG Time: 1525 Rate: 80 Rhythm: normal sinus rhythm Axis: normal Intervals: qtc 408 QRS: narrow, q waves v1 ST changes: no st elevation Impression: abnormal ekg   RADIOLOGY I independently interpreted and visualized the CXR. My interpretation: No pneumonia Radiology interpretation:  IMPRESSION:  1. No active cardiopulmonary disease.  2.  Aortic Atherosclerosis (ICD10-I70.0).      PROCEDURES:  Critical Care performed: No    MEDICATIONS ORDERED IN ED: Medications - No data to display   IMPRESSION / MDM / ASSESSMENT AND PLAN / ED COURSE  I reviewed the triage vital signs and the nursing notes.                              Differential diagnosis includes, but is not limited to, ACS, pneumonia, heart burn  Patient's presentation is most consistent with acute presentation with potential threat to life or bodily function.   Patient presented to  the emergency department today because of concerns for chest burning.  EKG without concerning ST elevation.  Troponin negative.  At this time given the length of symptoms I would expect some elevation of patient was experiencing ACS.  Chest x-ray without concerning abnormality.  Patient did feel significant improvement of her chest burning with GI cocktail.  At this time I think esophagitis unlikely.  Discussed this with the patient.  Will plan on discharging with further medication to help with her symptoms and dietary guidelines.     FINAL CLINICAL IMPRESSION(S) / ED DIAGNOSES   Final diagnoses:  Gastroesophageal reflux disease with esophagitis, unspecified whether hemorrhage     Note:  This document was prepared using Dragon voice recognition software and may include unintentional dictation errors.    Phineas Semen, MD 01/12/24 2125

## 2024-01-22 ENCOUNTER — Other Ambulatory Visit: Payer: Self-pay

## 2024-01-22 ENCOUNTER — Emergency Department
Admission: EM | Admit: 2024-01-22 | Discharge: 2024-01-22 | Disposition: A | Discharge status: Home / Self Care | Attending: Emergency Medicine | Admitting: Emergency Medicine

## 2024-01-22 ENCOUNTER — Emergency Department

## 2024-01-22 DIAGNOSIS — M25532 Pain in left wrist: Secondary | ICD-10-CM | POA: Diagnosis not present

## 2024-01-22 DIAGNOSIS — T07XXXA Unspecified multiple injuries, initial encounter: Secondary | ICD-10-CM

## 2024-01-22 DIAGNOSIS — W19XXXA Unspecified fall, initial encounter: Secondary | ICD-10-CM

## 2024-01-22 DIAGNOSIS — T148XXA Other injury of unspecified body region, initial encounter: Secondary | ICD-10-CM | POA: Diagnosis not present

## 2024-01-22 DIAGNOSIS — M79632 Pain in left forearm: Secondary | ICD-10-CM | POA: Diagnosis not present

## 2024-01-22 DIAGNOSIS — W108XXA Fall (on) (from) other stairs and steps, initial encounter: Secondary | ICD-10-CM | POA: Diagnosis not present

## 2024-01-22 DIAGNOSIS — G8929 Other chronic pain: Secondary | ICD-10-CM | POA: Diagnosis present

## 2024-01-22 DIAGNOSIS — E119 Type 2 diabetes mellitus without complications: Secondary | ICD-10-CM | POA: Insufficient documentation

## 2024-01-22 HISTORY — DX: Type 2 diabetes mellitus without complications: E11.9

## 2024-01-22 MED ORDER — FLUORESCEIN SODIUM 1 MG OP STRP: 1.0000 | ORAL_STRIP | Freq: Once | OPHTHALMIC | Status: DC

## 2024-01-22 MED ORDER — OXYCODONE HCL 5 MG PO TABS
5.0000 mg | ORAL_TABLET | Freq: Once | ORAL | Status: AC
  Administered 2024-01-22: 5 mg via ORAL
  Filled 2024-01-22: qty 1

## 2024-01-22 NOTE — Discharge Instructions (Addendum)
 Follow-up with your primary care provider if any continued problems or concerns.  Use the Ace wrap for your wrist as needed for protection and support.  Ice or heat to your muscles as needed for discomfort.  Continue with your regular medication as prescribed by your doctor.

## 2024-01-22 NOTE — ED Notes (Signed)
Ace wrap applied to left wrist.

## 2024-01-22 NOTE — ED Triage Notes (Signed)
 Pt to ED for mechanical fall 2 days, states "my whole left side hit the concrete". States whole L side is very sore since last night including L arm, leg, hip. Pt uses cane at baseline. No obvious deformities noted. States has bruising to back of L arm.

## 2024-01-22 NOTE — ED Provider Notes (Signed)
 [facility] Provider Note    Event Date/Time   First MD Initiated Contact with Patient 01/22/24 [phone]     (approximate)   History   Fall   HPI  Betty Ferguson is a 63 y.o. female   presents to the ED with multiple complaints.  Patient had a mechanical fall 2 days ago on concrete steps.  Patient denies loss of consciousness but admits that this was an unwitnessed fall.  Patient also complains of the "whole left side of her body hurting".  Patient uses a cane at baseline and has continued to ambulate.  She reports that she has Percocet and methadone  at home which does not seem to be helping with her pain.  She denies any nausea, vomiting, visual changes or change in speech.  Patient has history of TIA, GERD, diabetes type 2, rheumatoid arthritis, chronic pain.      Physical Exam   Triage Vital Signs: ED Triage Vitals  Encounter Vitals Group     BP 01/22/24 0915 139/76     Systolic BP Percentile --      Diastolic BP Percentile --      Pulse Rate 01/22/24 0915 83     Resp 01/22/24 0915 20     Temp 01/22/24 0915 98 F (36.7 C)     Temp Source 01/22/24 0915 Oral     SpO2 01/22/24 0915 98 %     Weight 01/22/24 0915 174 lb (78.9 kg)     Height 01/22/24 0915 5\' 2"  (1.575 m)     Head Circumference --      Peak Flow --      Pain Score 01/22/24 0913 9     Pain Loc --      Pain Education --      Exclude from Growth Chart --     Most recent vital signs: Vitals:   01/22/24 0915 01/22/24 1209  BP: 139/76 (!) 149/81  Pulse: 83 80  Resp: 20 18  Temp: 98 F (36.7 C) 98.3 F (36.8 C)  SpO2: 98% 97%     General: Awake, no distress.  Alert, talkative, answers questions appropriately. CV:  Good peripheral perfusion.  Heart regular rate and rhythm. Resp:  Normal effort.  Lungs clear bilaterally. Abd:  No distention.  Other:  PERRLA, EOMI's, speech is normal, patient is slow with range of motion secondary to discomfort.  There is no tenderness on palpation  of cervical, thoracic or lumbar spine.  Patient is tender bilateral anterior knees without deformity present.  Left lateral malleolus with soft tissue edema and tenderness noted.  No gross deformity and skin is intact.  Tenderness on palpation of left hip.  No rotation or shortening is appreciated.  Patient is also tender to light palpation soft tissue over the femur.  She complains of discomfort with palpation mid forearm and wrist area.  She is able to slow to flex and extend and limited and full range of motion.  Skin is intact.  Motor and sensory function intact.  Pulses present.   ED Results / Procedures / Treatments   Labs (all labs ordered are listed, but only abnormal results are displayed) Labs Reviewed - No data to display    RADIOLOGY CT head per radiology is negative for acute intracranial changes. Left forearm images were reviewed and interpreted by myself independent of the radiologist and was negative for fracture. Left femur x-ray images were reviewed and interpreted by myself independent of the radiologist and was  negative.  Official radiology report also negative. Left hip x-ray images with pelvis were reviewed and interpreted by myself independent of radiologist without fracture or dislocation noted. Left ankle x-ray images were reviewed and interpreted by myself independent of radiology and was negative.    PROCEDURES:  Critical Care performed:   Procedures   MEDICATIONS ORDERED IN ED: Medications  oxyCODONE  (Oxy IR/ROXICODONE ) immediate release tablet 5 mg (5 mg Oral Given 01/22/24 1045)     IMPRESSION / MDM / ASSESSMENT AND PLAN / ED COURSE  I reviewed the triage vital signs and the nursing notes.   Differential diagnosis includes, but is not limited to, head injury, skull fracture, fracture upper extremity, fracture bilateral knees, fracture dislocation hip, sprain, strain, fracture, dislocation left ankle.  63 year old female presents to the ED after a  fall that occurred 2 days ago in which she still continues to have pain in various places.  This was a mechanical fall on concrete steps.  Patient currently suffers from chronic pain and continues to take her Percocet and methadone  that she has at home.  Patient is reassured with x-rays showing no no acute fracture/dislocation and her CT was negative for fracture/intracranial injury.  Patient is to continue with her regular medications and follow-up with her PCP if any continued problems.    Clinical Course as of 01/22/24 1428  Sun Jan 22, 2024  1019 CT Head Wo Contrast [FG]    Clinical Course User Index [FG] Rosanne Commodore, [place]   Patient's presentation is most consistent with acute illness / injury with system symptoms.  FINAL CLINICAL IMPRESSION(S) / ED DIAGNOSES   Final diagnoses:  Multiple contusions  Fall, initial encounter     Rx / DC Orders   ED Discharge Orders     None        Note:  This document was prepared using Dragon voice recognition software and may include unintentional dictation errors.   Stafford Eagles, PA-C 01/22/24 1428    Ruth Cove, MD 01/22/24 [phone]

## 2024-02-06 ENCOUNTER — Emergency Department
Admission: EM | Admit: 2024-02-06 | Discharge: 2024-02-06 | Disposition: A | Discharge status: Home / Self Care | Attending: Emergency Medicine | Admitting: Emergency Medicine

## 2024-02-06 ENCOUNTER — Encounter: Payer: Self-pay | Admitting: *Deleted

## 2024-02-06 ENCOUNTER — Other Ambulatory Visit: Payer: Self-pay

## 2024-02-06 ENCOUNTER — Emergency Department

## 2024-02-06 DIAGNOSIS — K5903 Drug induced constipation: Secondary | ICD-10-CM | POA: Diagnosis not present

## 2024-02-06 DIAGNOSIS — T402X5A Adverse effect of other opioids, initial encounter: Secondary | ICD-10-CM | POA: Insufficient documentation

## 2024-02-06 DIAGNOSIS — R1032 Left lower quadrant pain: Secondary | ICD-10-CM

## 2024-02-06 DIAGNOSIS — E119 Type 2 diabetes mellitus without complications: Secondary | ICD-10-CM | POA: Diagnosis not present

## 2024-02-06 DIAGNOSIS — I714 Abdominal aortic aneurysm, without rupture, unspecified: Secondary | ICD-10-CM | POA: Insufficient documentation

## 2024-02-06 LAB — CBC
HCT: 41.2 % (ref 36.0–46.0)
Hemoglobin: 13.2 g/dL (ref 12.0–15.0)
MCH: 30.7 pg (ref 26.0–34.0)
MCHC: 32 g/dL (ref 30.0–36.0)
MCV: 95.8 fL (ref 80.0–100.0)
Platelets: 221 10*3/uL (ref 150–400)
RBC: 4.3 MIL/uL (ref 3.87–5.11)
RDW: 15.2 % (ref 11.5–15.5)
WBC: 5.6 10*3/uL (ref 4.0–10.5)
nRBC: 0 % (ref 0.0–0.2)

## 2024-02-06 LAB — COMPREHENSIVE METABOLIC PANEL WITH GFR
ALT: 19 U/L (ref 0–44)
AST: 25 U/L (ref 15–41)
Albumin: 3.6 g/dL (ref 3.5–5.0)
Alkaline Phosphatase: 81 U/L (ref 38–126)
Anion gap: 12 (ref 5–15)
BUN: 13 mg/dL (ref 8–23)
CO2: 22 mmol/L (ref 22–32)
Calcium: 9.1 mg/dL (ref 8.9–10.3)
Chloride: 105 mmol/L (ref 98–111)
Creatinine, Ser: 0.63 mg/dL (ref 0.44–1.00)
GFR, Estimated: >60 mL/min (ref >60)
Glucose, Bld: 113 mg/dL — ABNORMAL HIGH (ref 70–99)
Potassium: 3.9 mmol/L (ref 3.5–5.1)
Sodium: 139 mmol/L (ref 135–145)
Total Bilirubin: 0.7 mg/dL (ref 0.0–1.2)
Total Protein: 6.2 g/dL — ABNORMAL LOW (ref 6.5–8.1)

## 2024-02-06 LAB — LIPASE, BLOOD: Lipase: 28 U/L (ref 11–51)

## 2024-02-06 MED ORDER — HYDROMORPHONE HCL 1 MG/ML IJ SOLN
1.0000 mg | Freq: Once | INTRAMUSCULAR | Status: AC
  Administered 2024-02-06: 1 mg via INTRAVENOUS
  Filled 2024-02-06: qty 1

## 2024-02-06 MED ORDER — IOHEXOL 300 MG/ML  SOLN
100.0000 mL | Freq: Once | INTRAMUSCULAR | Status: AC | PRN
  Administered 2024-02-06: 100 mL via INTRAVENOUS

## 2024-02-06 MED ORDER — POLYETHYLENE GLYCOL 3350 17 G PO PACK: 17.0000 g | PACK | Freq: Three times a day (TID) | ORAL | 3 refills | Status: AC

## 2024-02-06 MED ORDER — MAGNESIUM CITRATE PO SOLN: 1.0000 | Freq: Every day | ORAL | 2 refills | Status: AC | PRN

## 2024-02-06 MED ORDER — MINERAL OIL RE ENEM: 1.0000 | ENEMA | Freq: Every day | RECTAL | 2 refills | Status: AC | PRN

## 2024-02-06 NOTE — ED Provider Notes (Signed)
 [facility] Provider Note    Event Date/Time   First MD Initiated Contact with Patient 02/06/24 2311     (approximate)   History   Abdominal Pain   HPI  Betty Ferguson is a 62 y.o. female   Past medical history of collagen vascular disease, diabetes, fibromyalgia chronic pain on chronic narcotic therapy with pain clinic following, saccular aneurysm infrarenal aorta, who presents to the Emergency Department with left lower quadrant pain.  It started earlier in the day and has been slowly worsening.  She has been constipated and stopped taking her MiraLAX  because it makes her stools loose, a few weeks ago.  No urinary symptoms.  No vomiting.  No obvious trauma.   External Medical Documents Reviewed: Hospital notes from September 2024 with left lower quadrant abdominal pain-see below   Cox, Amy N, DO9/04/2023 6:54 PM With radiation to left flank, left posterior hip Etiology workup in progress query exacerbation of rheumatoid arthritis flare While the pain is consistent with a single dermatome, however patient symptoms is not consistent with shingles And I do not appreciate any rash in her left lower quadrant abdominal to left flank and left hip/back Left hip x-ray 3 views ordered Symptomatic support: Oxycodone  0.5 mg IV every 4 hours as needed for moderate pain, 20 hours of coverage ordered; fentanyl  50 mcg IV every 4 hours as needed for severe pain, 20 hours of coverage ordered; Dilaudid  1 mg IV every 4 hours as needed for severe pain not responsive to IV Dilaudid  0.5 mg or fentanyl  50 mcg IV, 20 hours of coverage ordered   Physical Exam   Triage Vital Signs: ED Triage Vitals  Encounter Vitals Group     BP 02/06/24 1650 (!) 175/91     Systolic BP Percentile --      Diastolic BP Percentile --      Pulse Rate 02/06/24 1650 76     Resp 02/06/24 1650 18     Temp 02/06/24 1650 (!) 97.5 F (36.4 C)     Temp Source 02/06/24 1650 Oral     SpO2 02/06/24 1650  96 %     Weight 02/06/24 1648 170 lb (77.1 kg)     Height 02/06/24 1648 5\' 2"  (1.575 m)     Head Circumference --      Peak Flow --      Pain Score 02/06/24 1648 10     Pain Loc --      Pain Education --      Exclude from Growth Chart --     Most recent vital signs: Vitals:   02/06/24 1650 02/06/24 2222  BP: (!) 175/91 (!) 174/94  Pulse: 76 70  Resp: 18 16  Temp: (!) 97.5 F (36.4 C) 97.8 F (36.6 C)  SpO2: 96% 100%    General: Awake, no distress.  CV:  Good peripheral perfusion.  Resp:  Normal effort.  Abd:  No distention.  Other:  Mild left lower quadrant tenderness to palpation without rigidity or guarding.  No rash to the affected area.  Able to range at the hip.  Neurovascular intact to both lower extremities.   ED Results / Procedures / Treatments   Labs (all labs ordered are listed, but only abnormal results are displayed) Labs Reviewed  COMPREHENSIVE METABOLIC PANEL WITH GFR - Abnormal; Notable for the following components:      Result Value   Glucose, Bld 113 (*)    Total Protein 6.2 (*)  All other components within normal limits  LIPASE, BLOOD  CBC     I ordered and reviewed the above labs they are notable for cell counts and electrolytes unremarkable  RADIOLOGY I independently reviewed and interpreted CT scan abdomen and see a large stool burden I also reviewed radiologist's formal read.   PROCEDURES:  Critical Care performed: No  Procedures   MEDICATIONS ORDERED IN ED: Medications  iohexol  (OMNIPAQUE ) 300 MG/ML solution 100 mL (100 mLs Intravenous Contrast Given 02/06/24 2234)  HYDROmorphone  (DILAUDID ) injection 1 mg (1 mg Intravenous Given 02/06/24 2333)     IMPRESSION / MDM / ASSESSMENT AND PLAN / ED COURSE  I reviewed the triage vital signs and the nursing notes.                                Patient's presentation is most consistent with acute presentation with potential threat to life or bodily function.  Differential diagnosis  includes, but is not limited to, chronic abdominal pain, diverticulitis, appendicitis, obstruction, perforated viscus, ruptured aneurysm   The patient is on the cardiac monitor to evaluate for evidence of arrhythmia and/or significant heart rate changes.  MDM:    She is here with left lower quadrant pain and a CT scan revealed large stool burden, stable unchanged infrarenal aneurysm.  I think that her pain is likely due to her constipation in the setting of stopping her bowel regimen and ongoing narcotic use.  Fortunately CT scan and labs unremarkable for any emergency life-threatening conditions at this time.  Given that her pain is quite severe she is requesting a one-time dose of IV Dilaudid  and I think this is okay but I asked her to speak with her pain specialist to see if they can adjust her narcotic regimen, and restarted her on a bowel regimen.  She requested a GI referral for colonoscopy which I put in order for.  She has a PMD follow-up tomorrow.  Discharge.       FINAL CLINICAL IMPRESSION(S) / ED DIAGNOSES   Final diagnoses:  Left lower quadrant abdominal pain  Drug-induced constipation  Abdominal aortic aneurysm (AAA) without rupture, unspecified part [facility])     Rx / DC Orders   ED Discharge Orders          Ordered    Ambulatory referral to Gastroenterology        02/06/24 2327    polyethylene glycol (MIRALAX ) 17 g packet  3 times daily        02/06/24 2327    magnesium  citrate SOLN  Daily PRN        02/06/24 2327    mineral oil enema  Daily PRN        02/06/24 2327             Note:  This document was prepared using Dragon voice recognition software and may include unintentional dictation errors.    Buell Carmin, MD 02/06/24 [phone]

## 2024-02-06 NOTE — Discharge Instructions (Addendum)
 Fortunately your evaluation the emerged apartment did not show any emergency life-threatening conditions account for your belly pain tonight.  You are quite constipated and I think it may be due to your chronic narcotic use.  Please take the laxatives as prescribed and follow-up with your doctor tomorrow as scheduled.  I made a referral to a GI doctor for colonoscopy as requested.  When you meet with your primary doctor tomorrow if you have determined that you have not yet talked to the vascular doctor about your aneurysm, then please see the contact information for Dr. Prescilla Brod who is a vascular surgeon and expert in these types of aneurysms.  Thank you for choosing us  for your health care today!  Please see your primary doctor this week for a follow up appointment.   If you have any new, worsening, or unexpected symptoms call your doctor right away or come back to the emergency department for reevaluation.  It was my pleasure to care for you today.   Arron Large Margery Sheets, MD

## 2024-02-06 NOTE — ED Triage Notes (Signed)
 Pt to triage via wheelchalr.  Pt has left lower abd pain.  Pt has nausea.  No v/d.  Denies urinary sx.  Pt alert.  Speech clear.
